# Patient Record
Sex: Male | Born: 1995 | Race: Black or African American | Hispanic: No | Marital: Married | Smoking: Current every day smoker
Health system: Southern US, Community
[De-identification: ages and names within clinical notes are randomized; demographics above are authoritative.]

## PROBLEM LIST (undated history)

## (undated) DIAGNOSIS — J45909 Unspecified asthma, uncomplicated: Secondary | ICD-10-CM

## (undated) DIAGNOSIS — J189 Pneumonia, unspecified organism: Secondary | ICD-10-CM

---
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
## 2014-01-20 ENCOUNTER — Emergency Department (HOSPITAL_COMMUNITY)
Admission: EM | Admit: 2014-01-20 | Discharge: 2014-01-20 | Disposition: A | Discharge status: Home / Self Care | Payer: Medicaid Other | Attending: Emergency Medicine | Admitting: Emergency Medicine

## 2014-01-20 ENCOUNTER — Encounter (HOSPITAL_COMMUNITY): Payer: Self-pay

## 2014-01-20 DIAGNOSIS — Z720 Tobacco use: Secondary | ICD-10-CM | POA: Diagnosis not present

## 2014-01-20 DIAGNOSIS — Z113 Encounter for screening for infections with a predominantly sexual mode of transmission: Secondary | ICD-10-CM | POA: Insufficient documentation

## 2014-01-20 DIAGNOSIS — Z79899 Other long term (current) drug therapy: Secondary | ICD-10-CM | POA: Diagnosis not present

## 2014-01-20 DIAGNOSIS — Z711 Person with feared health complaint in whom no diagnosis is made: Secondary | ICD-10-CM

## 2014-01-20 LAB — URINALYSIS, ROUTINE W REFLEX MICROSCOPIC
BILIRUBIN URINE: NEGATIVE
GLUCOSE, UA: NEGATIVE mg/dL
HGB URINE DIPSTICK: NEGATIVE
Ketones, ur: NEGATIVE mg/dL
LEUKOCYTES UA: NEGATIVE
Nitrite: NEGATIVE
Protein, ur: NEGATIVE mg/dL
Specific Gravity, Urine: 1.007 (ref 1.005–1.030)
Urobilinogen, UA: 0.2 mg/dL (ref 0.0–1.0)
pH: 7.5 (ref 5.0–8.0)

## 2014-01-20 MED ORDER — AZITHROMYCIN 1 G PO PACK
1.0000 g | PACK | Freq: Once | ORAL | Status: AC
  Administered 2014-01-20: 1 g via ORAL
  Filled 2014-01-20: qty 1

## 2014-01-20 MED ORDER — CEFTRIAXONE SODIUM 250 MG IJ SOLR
250.0000 mg | Freq: Once | INTRAMUSCULAR | Status: AC
  Administered 2014-01-20: 250 mg via INTRAMUSCULAR
  Filled 2014-01-20: qty 250

## 2014-01-20 MED ORDER — LIDOCAINE HCL 1 % IJ SOLN
INTRAMUSCULAR | Status: AC
  Administered 2014-01-20: 20 mL
  Filled 2014-01-20: qty 20

## 2014-01-20 NOTE — ED Notes (Signed)
Per pt, having redness at tip of penis and warmth.  Denies pain or discharge.  Wants STD check

## 2014-01-20 NOTE — Discharge Instructions (Signed)

## 2014-01-20 NOTE — ED Provider Notes (Signed)
CSN: [id number]     Arrival date & time 01/20/14  1857 History  This chart was scribed for non-physician practitioner, Jinny Sanders, PA-C,working with Donnetta Hutching, MD, by Karle Plumber, ED Scribe. This patient was seen in room WTR6/WTR6 and the patient's care was started at 7:49 PM.  Chief Complaint  Patient presents with  . SEXUALLY TRANSMITTED DISEASE   The history is provided by the patient. No language interpreter was used.    HPI Comments:  Neil Bryan is a 19 y.o. male who presents to the Emergency Department complaining of warmth to the tip of the penis secondary to having unprotected sex with a new sexual partner within the past couple of weeks. He reports associated irritation of the tip of the penis and an occasional, slight pinching pain to the tip. He reports one episode of mild, right teste pain last night that has since resolved. Denies dysuria, penile discharge, fever, abdominal pain, nausea, vomiting or discoloration of the penis.   History reviewed. No pertinent past medical history. History reviewed. No pertinent past surgical history. Family History  Problem Relation Age of Onset  . Hypertension Father   . Hypertension Paternal Grandfather    History  Substance Use Topics  . Smoking status: Current Every Day Smoker -- 0.50 packs/day    Types: Cigarettes  . Smokeless tobacco: Never Used  . Alcohol Use: Yes     Comment: Occasional    Review of Systems  Constitutional: Negative for fever and chills.  Gastrointestinal: Negative for nausea, vomiting and abdominal pain.  Genitourinary: Positive for penile pain and testicular pain. Negative for dysuria, discharge and scrotal swelling.       Redness and warmth to tip of penis.  All other systems reviewed and are negative.   Allergies  Review of patient's allergies indicates no known allergies.  Home Medications   Prior to Admission medications   Medication Sig Start Date End Date Taking? Authorizing Provider   albuterol (PROVENTIL HFA;VENTOLIN HFA) 108 (90 BASE) MCG/ACT inhaler Inhale 1-2 puffs into the lungs every 4 (four) hours as needed for wheezing or shortness of breath. 08/02/13   Graylon Good, PA-C  azithromycin (ZITHROMAX Z-PAK) 250 MG tablet Use as directed 08/02/13   Graylon Good, PA-C  benzonatate (TESSALON) 200 MG capsule Take 1 capsule (200 mg total) by mouth 3 (three) times daily as needed for cough. 05/28/13   Olivia Mackie, MD  Fexofenadine HCl (ALLEGRA PO) Take 1 tablet by mouth once a week. Days of the week vary    Historical Provider, MD  ibuprofen (ADVIL,MOTRIN) 800 MG tablet Take 800 mg by mouth 3 (three) times daily as needed for moderate pain.    Historical Provider, MD  predniSONE (DELTASONE) 10 MG tablet 4 tabs PO QD for 4 days; 3 tabs PO QD for 3 days; 2 tabs PO QD for 2 days; 1 tab PO QD for 1 day 08/02/13   Graylon Good, PA-C   Triage Vitals: BP 158/77 mmHg  Pulse 114  Temp(Src) 99.9 F (37.7 C) (Oral)  Resp 16  SpO2 99% Physical Exam  Constitutional: He is oriented to person, place, and time. He appears well-developed and well-nourished. No distress.  HENT:  Head: Normocephalic and atraumatic.  Eyes: EOM are normal. Right eye exhibits no discharge. Left eye exhibits no discharge. No scleral icterus.  Neck: Normal range of motion.  Cardiovascular: Normal rate.   Pulmonary/Chest: Effort normal. No respiratory distress.  Abdominal: Hernia confirmed negative in the  right inguinal area and confirmed negative in the left inguinal area.  Genitourinary: Testes normal and penis normal. Cremasteric reflex is present. Right testis shows no mass, no swelling and no tenderness. Left testis shows no mass, no swelling and no tenderness. Circumcised. No phimosis, paraphimosis, hypospadias, penile erythema or penile tenderness. No discharge found.  No erythema or discharge noted on exam. Normal scrotum and testicular exam. Chaperone present during entire genitourinary exam.   Musculoskeletal: Normal range of motion.  Neurological: He is alert and oriented to person, place, and time.  Skin: Skin is warm and dry. He is not diaphoretic.  Psychiatric: He has a normal mood and affect. His behavior is normal.  Nursing note and vitals reviewed.   ED Course  Procedures (including critical care time) DIAGNOSTIC STUDIES: Oxygen Saturation is 99% on RA, normal by my interpretation.   COORDINATION OF CARE: 7:56 PM- Will test and treat for GC/chlamydia. Pt verbalizes understanding and agrees to plan.  Medications  cefTRIAXone (ROCEPHIN) injection 250 mg (250 mg Intramuscular Given 01/20/14 2037)  azithromycin (ZITHROMAX) powder 1 g (1 g Oral Given 01/20/14 2035)  lidocaine (XYLOCAINE) 1 % (with pres) injection (20 mLs  Given 01/20/14 2037)    Labs Review Labs Reviewed  URINALYSIS, ROUTINE W REFLEX MICROSCOPIC - Abnormal; Notable for the following:    APPearance CLOUDY (*)    All other components within normal limits  GC/CHLAMYDIA PROBE AMP  RPR  HIV ANTIBODY (ROUTINE TESTING)    Imaging Review No results found.   EKG Interpretation None      MDM   Final diagnoses:  Concern about STD in male without diagnosis    Patient to be discharged with instructions to follow up with PCP. Pt understands GC/Chlamydia cultures pending and that they will need to inform all sexual partners within the last 6 months if results return positive. Pt has been treated prophylacticly with azithromycin and rocephin due to pts history. Pt advised that he will receive a call in 48 hours if the test is positive and to refrain from sexual activity for 48 hours. If the test is positive, pt is advised to refrain from sexual activity for 10 days for the medicine to take effect.  Pt not concerning for balantitis or epididymitis. Results also pending for HIV, RPR.  I personally performed the services described in this documentation, which was scribed in my presence. The recorded information  has been reviewed and is accurate.  BP 134/92 mmHg  Pulse 85  Temp(Src) 99.9 F (37.7 C) (Oral)  Resp 20  SpO2 97%  Signed,  Ladona MowJoe Lyly Canizales, PA-C 9:51 PM   Monte FantasiaJoseph W Yosgart Pavey, PA-C 01/20/14 2152  Donnetta HutchingBrian Cook, MD 01/21/14 1118

## 2014-01-21 LAB — GC/CHLAMYDIA PROBE AMP
CT Probe RNA: NEGATIVE
GC PROBE AMP APTIMA: NEGATIVE

## 2014-01-22 LAB — HIV ANTIBODY (ROUTINE TESTING W REFLEX)
HIV 1/O/2 Abs-Index Value: <1 (ref <1.00)
HIV-1/HIV-2 Ab: NONREACTIVE

## 2014-01-22 LAB — RPR: RPR Ser Ql: NONREACTIVE — AB

## 2014-03-13 ENCOUNTER — Encounter (HOSPITAL_COMMUNITY): Payer: Self-pay | Admitting: Emergency Medicine

## 2014-03-13 ENCOUNTER — Emergency Department (INDEPENDENT_AMBULATORY_CARE_PROVIDER_SITE_OTHER)
Admission: EM | Admit: 2014-03-13 | Discharge: 2014-03-13 | Disposition: A | Discharge status: Home / Self Care | Payer: BLUE CROSS/BLUE SHIELD | Source: Home / Self Care | Attending: Emergency Medicine | Admitting: Emergency Medicine

## 2014-03-13 DIAGNOSIS — M6740 Ganglion, unspecified site: Secondary | ICD-10-CM | POA: Diagnosis not present

## 2014-03-13 MED ORDER — MELOXICAM 15 MG PO TABS: 15.0000 mg | ORAL_TABLET | Freq: Every day | ORAL | Status: DC

## 2014-03-13 NOTE — ED Provider Notes (Signed)
   Chief Complaint   Wrist Problem   History of Present Illness   Neil PongKevin A Vuncannon is a 19 year old male who has had a four-day history of a cystic mass in the volar right wrist, just proximal to the proximal palmar crease. He denies any injury to the area. It hurts if he presses on it. The wrist feels a little bit tight, but has a full range of motion with minimal pain. No numbness or tingling in the hand or wrist.  Review of Systems   Other than as noted above, the patient denies any of the following symptoms: Systemic:  No fevers or chills. Musculoskeletal:  No joint pain or arthritis.  Neurological:  No muscular weakness or paresthesias.  PMFSH   Past medical history, family history, social history, meds, and allergies were reviewed.     Physical Examination   Vital signs:  BP 138/80 mmHg  Pulse 82  Temp(Src) 99 F (37.2 C) (Oral)  Resp 17  SpO2 98% Gen:  Alert and in no distress. Musculoskeletal:  Exam of the hand reveals there is a 1 cm, cystic feeling mass which is minimally tender to palpation in the volar aspect of the right wrist, about 2 cm from the proximal wrist crease.  Otherwise, all joints had a full a ROM with no swelling, bruising or deformity.  No edema, pulses full. Extremities were warm and pink.  Capillary refill was brisk.  Skin:  Clear, warm and dry.  No rash. Neuro:  Alert and oriented.  Muscle strength was normal.  Sensation was intact to light touch.   Course in Urgent Care Center   He was given a wrist splint.  Assessment   The encounter diagnosis was Ganglion cyst.  Probably due to overuse.  Plan  1.  Meds:  The following meds were prescribed:   Discharge Medication List as of 03/13/2014  7:35 PM    START taking these medications   Details  meloxicam (MOBIC) 15 MG tablet Take 1 tablet (15 mg total) by mouth daily., Starting 03/13/2014, Until Discontinued, Normal        2.  Patient Education/Counseling:  The patient was given  appropriate handouts, self care instructions, and instructed in symptomatic relief, including rest and activity, ice, and elevation. He is to wear the wrist splint continuously for the next 2 weeks and take the anti-inflammatories. If this hasn't gotten better suggest he follow-up with Dr. Bradly BienenstockFred Ortmann.  3.  Follow up:  The patient was told to follow up here if no better in 3 to 4 days, or sooner if becoming worse in any way, and given some red flag symptoms such as worsening pain, fever, swelling, or neurological symptoms which would prompt immediate return.        Reuben Likesavid C Deon Duer, MD 03/13/14 [phone]

## 2014-03-13 NOTE — Discharge Instructions (Signed)

## 2014-03-13 NOTE — ED Notes (Signed)
Pt states that he woke up 3 days ago to a lump on his right wrist it is swollen. Pt denies any injury or fall.

## 2014-05-11 ENCOUNTER — Emergency Department (HOSPITAL_COMMUNITY): Payer: Medicaid Other

## 2014-05-11 ENCOUNTER — Encounter (HOSPITAL_COMMUNITY): Payer: Self-pay | Admitting: Emergency Medicine

## 2014-05-11 ENCOUNTER — Emergency Department (HOSPITAL_COMMUNITY)
Admission: EM | Admit: 2014-05-11 | Discharge: 2014-05-11 | Disposition: A | Discharge status: Home / Self Care | Payer: Medicaid Other | Attending: Emergency Medicine | Admitting: Emergency Medicine

## 2014-05-11 DIAGNOSIS — Z720 Tobacco use: Secondary | ICD-10-CM | POA: Insufficient documentation

## 2014-05-11 DIAGNOSIS — R000 Tachycardia, unspecified: Secondary | ICD-10-CM | POA: Diagnosis not present

## 2014-05-11 DIAGNOSIS — J069 Acute upper respiratory infection, unspecified: Secondary | ICD-10-CM | POA: Insufficient documentation

## 2014-05-11 DIAGNOSIS — Z791 Long term (current) use of non-steroidal anti-inflammatories (NSAID): Secondary | ICD-10-CM | POA: Insufficient documentation

## 2014-05-11 DIAGNOSIS — J029 Acute pharyngitis, unspecified: Secondary | ICD-10-CM | POA: Insufficient documentation

## 2014-05-11 DIAGNOSIS — R05 Cough: Secondary | ICD-10-CM | POA: Diagnosis present

## 2014-05-11 DIAGNOSIS — H9201 Otalgia, right ear: Secondary | ICD-10-CM | POA: Insufficient documentation

## 2014-05-11 DIAGNOSIS — J45901 Unspecified asthma with (acute) exacerbation: Secondary | ICD-10-CM | POA: Insufficient documentation

## 2014-05-11 DIAGNOSIS — Z79899 Other long term (current) drug therapy: Secondary | ICD-10-CM | POA: Diagnosis not present

## 2014-05-11 DIAGNOSIS — R059 Cough, unspecified: Secondary | ICD-10-CM

## 2014-05-11 LAB — RAPID STREP SCREEN (MED CTR MEBANE ONLY): Streptococcus, Group A Screen (Direct): NEGATIVE

## 2014-05-11 MED ORDER — IBUPROFEN 400 MG PO TABS
400.0000 mg | ORAL_TABLET | Freq: Once | ORAL | Status: AC
  Administered 2014-05-11: 400 mg via ORAL
  Filled 2014-05-11: qty 1

## 2014-05-11 MED ORDER — PREDNISONE 20 MG PO TABS
60.0000 mg | ORAL_TABLET | Freq: Once | ORAL | Status: AC
  Administered 2014-05-11: 60 mg via ORAL
  Filled 2014-05-11: qty 3

## 2014-05-11 MED ORDER — PREDNISONE 20 MG PO TABS: ORAL_TABLET | ORAL | Status: DC

## 2014-05-11 NOTE — ED Provider Notes (Signed)
CSN: [id number]     Arrival date & time 05/11/14  2033 History  This chart was scribed for non-physician practitioner working with Blake Divine, MD by Richarda Overlie, ED Scribe. This patient was seen in room TR06C/TR06C and the patient's care was started at 9:47 PM.   Chief Complaint  Patient presents with  . Cough   Patient is a 19 y.o. male presenting with cough. The history is provided by the patient. No language interpreter was used.  Cough Cough characteristics:  Productive Sputum characteristics:  Green and yellow Severity:  Mild Onset quality:  Unable to specify Duration:  3 days Timing:  Intermittent Chronicity:  New Smoker: no   Context: upper respiratory infection   Context: not sick contacts   Relieved by:  Decongestant Worsened by:  Nothing tried Ineffective treatments:  None tried Associated symptoms: chest pain (only with cough), ear pain, rhinorrhea, sinus congestion and sore throat   Associated symptoms: no chills, no ear fullness, no eye discharge, no fever, no myalgias, no rash, no shortness of breath and no wheezing    HPI Comments: Neil Bryan is a 19 y.o. male with a history of reactive airway disease who presents to the Emergency Department complaining of a productive cough with a green/yellow mucous for the last 3 days. Pt reports associated sore throat, rhinorrhea, right ear pain, and intermittent chest pain. He states that he only has CP when he coughs. He describes his pain right ear pain as dull, non-radiating and rates it as a 4/10 at this time.  Pt reports he had a low grade fever earlier with a temperature of 99.4.  He states that has been taking dayquil which provided him relief initially but has not recently. Pt reports a history of community acquired pneumonia and seasonal allergies. He reports no known sick contacts. He states that he has not recently traveled. Pt reports NKDA. He reports no history of DM or any other medical problems. He denies  fevers, chills, ear discharge, neck swelling, LAD, CP at rest, SOB, wheezing, abdominal pain, nausea and vomiting, diarrhea, dysuria, hematuria, weakness, tingling or numbness. Denies rashes.  History reviewed. No pertinent past medical history. History reviewed. No pertinent past surgical history. Family History  Problem Relation Age of Onset  . Hypertension Father   . Hypertension Paternal Grandfather    History  Substance Use Topics  . Smoking status: Current Every Day Smoker -- 0.50 packs/day    Types: Cigarettes  . Smokeless tobacco: Never Used  . Alcohol Use: Yes     Comment: Occasional    Review of Systems  Constitutional: Negative for fever and chills.  HENT: Positive for congestion, ear pain, rhinorrhea and sore throat. Negative for drooling, ear discharge, sinus pressure and trouble swallowing.   Eyes: Negative for pain and discharge.  Respiratory: Positive for cough. Negative for shortness of breath and wheezing.   Cardiovascular: Positive for chest pain (only with cough).  Gastrointestinal: Negative for nausea, vomiting, abdominal pain and diarrhea.  Genitourinary: Negative for dysuria and hematuria.  Musculoskeletal: Negative for myalgias, arthralgias and neck pain.  Skin: Negative for rash.  Allergic/Immunologic: Positive for environmental allergies. Negative for immunocompromised state.  Neurological: Negative for weakness and numbness.  Hematological: Negative for adenopathy.    10 Systems reviewed and all are negative for acute change except as noted in the HPI.  Allergies  Review of patient's allergies indicates no known allergies.  Home Medications   Prior to Admission medications   Medication Sig Start  Date End Date Taking? Authorizing Provider  albuterol (PROVENTIL HFA;VENTOLIN HFA) 108 (90 BASE) MCG/ACT inhaler Inhale 1-2 puffs into the lungs every 4 (four) hours as needed for wheezing or shortness of breath. 08/02/13   Graylon GoodZachary H Baker, PA-C   azithromycin (ZITHROMAX Z-PAK) 250 MG tablet Use as directed 08/02/13   Graylon GoodZachary H Baker, PA-C  benzonatate (TESSALON) 200 MG capsule Take 1 capsule (200 mg total) by mouth 3 (three) times daily as needed for cough. 05/28/13   Marisa Severinlga Otter, MD  Fexofenadine HCl (ALLEGRA PO) Take 1 tablet by mouth once a week. Days of the week vary    Historical Provider, MD  ibuprofen (ADVIL,MOTRIN) 800 MG tablet Take 800 mg by mouth 3 (three) times daily as needed for moderate pain.    Historical Provider, MD  meloxicam (MOBIC) 15 MG tablet Take 1 tablet (15 mg total) by mouth daily. 03/13/14   Reuben Likesavid C Keller, MD  predniSONE (DELTASONE) 10 MG tablet 4 tabs PO QD for 4 days; 3 tabs PO QD for 3 days; 2 tabs PO QD for 2 days; 1 tab PO QD for 1 day 08/02/13   Graylon GoodZachary H Baker, PA-C   BP 139/72 mmHg  Pulse 118  Temp(Src) 99.1 F (37.3 C) (Oral)  Resp 16  SpO2 95% Physical Exam  Constitutional: He is oriented to person, place, and time. Vital signs are normal. He appears well-developed and well-nourished.  Non-toxic appearance. No distress.  Afebrile although low grade temp of 99.1, nontoxic, NAD  HENT:  Head: Normocephalic and atraumatic.  Right Ear: Hearing, tympanic membrane, external ear and ear canal normal.  Left Ear: Hearing, tympanic membrane, external ear and ear canal normal.  Nose: Mucosal edema and rhinorrhea present.  Mouth/Throat: Uvula is midline and mucous membranes are normal. No trismus in the jaw. No uvula swelling. Posterior oropharyngeal erythema present. No oropharyngeal exudate, posterior oropharyngeal edema or tonsillar abscesses.  Ears clear bilaterally Nose with mild mucosal edema and erythema, clear rhinorrhea. Oropharynx with posterior injection, no tonsillar swelling or exudates, uvula midline without deviationn or swelling, no trismus or drooling, no PTA  Eyes: Conjunctivae and EOM are normal. Right eye exhibits no discharge. Left eye exhibits no discharge.  Neck: Normal range of motion.  Neck supple.  Cardiovascular: Regular rhythm, normal heart sounds and intact distal pulses.  Tachycardia present.  Exam reveals no gallop and no friction rub.   No murmur heard. Mildly tachycardic which resolved upon exam  Pulmonary/Chest: Effort normal. No respiratory distress. He has decreased breath sounds in the right lower field. He has no wheezes. He has no rhonchi. He has no rales. He exhibits tenderness. He exhibits no crepitus and no retraction.    Slightly diminished breath sounds in RLF, no w/r/r, no hypoxia or increased WOB, speaking in full sentences, SpO2 100% on RA. Mild chest wall TTP to lateral chest, no crepitus or deformity  Abdominal: Soft. Normal appearance and bowel sounds are normal. He exhibits no distension. There is no tenderness. There is no rigidity, no rebound and no guarding.  Musculoskeletal: Normal range of motion.  MAE x4 Strength and sensation grossly intact Distal pulses intact  Lymphadenopathy:       Head (right side): Tonsillar adenopathy present.       Head (left side): Tonsillar adenopathy present.    He has cervical adenopathy.  Shotty tonsillar and cervical LAD bilaterally, which is nonTTP  Neurological: He is alert and oriented to person, place, and time. He has normal strength. No sensory deficit.  Skin: Skin is warm, dry and intact. No rash noted.  Psychiatric: He has a normal mood and affect.  Nursing note and vitals reviewed.   ED Course  Procedures   DIAGNOSTIC STUDIES: Oxygen Saturation is 95% on RA, normal by my interpretation.    COORDINATION OF CARE: 9:55 PM Discussed treatment plan with pt at bedside and pt agreed to plan.   Labs Review Labs Reviewed  RAPID STREP SCREEN  CULTURE, GROUP A STREP    Imaging Review Dg Chest 2 View  05/11/2014   CLINICAL DATA:  Acute onset of cough and shortness of breath. Initial encounter.  EXAM: CHEST  2 VIEW  COMPARISON:  Chest radiograph performed 05/28/2013  FINDINGS: The lungs are  well-aerated. Mild peribronchial thickening is noted. There is no evidence of focal opacification, pleural effusion or pneumothorax.  The heart is normal in size; the mediastinal contour is within normal limits. No acute osseous abnormalities are seen.  IMPRESSION: Mild peribronchial thickening noted; lungs otherwise clear.   Electronically Signed   By: Roanna Raider M.D.   On: 05/11/2014 22:31     EKG Interpretation None      MDM   Final diagnoses:  Cough  URI (upper respiratory infection)  Sore throat  Reactive airway disease, unspecified asthma severity, with acute exacerbation    19 y.o. male here with cough, sore throat. Hx of reactive airway disease. No wheezing. Slightly diminished breath sounds on the RL field, will obtain imaging. Doubt need for nebs but will likely send home on prednisone for possible RAD exacerbation. Will give motrin here for pain. Will swab for strep although CENTOR criteria low therefore if neg will not treat. Will reassess shortly.   11:25 PM CXR neg. RST neg. Prednisone given here. Will have him f/up with PCP in 1wk. Will give prednisone taper. Discussed use of home inhaler. Discussed symptomatic care and f/up in 1wk. I explained the diagnosis and have given explicit precautions to return to the ER including for any other new or worsening symptoms. The patient understands and accepts the medical plan as it's been dictated and I have answered their questions. Discharge instructions concerning home care and prescriptions have been given. The patient is STABLE and is discharged to home in good condition.  I personally performed the services described in this documentation, which was scribed in my presence. The recorded information has been reviewed and is accurate.   BP 125/75 mmHg  Pulse 90  Temp(Src) 98.7 F (37.1 C) (Oral)  Resp 16  Ht  (1.88 m)  Wt 190 lb (86.183 kg)  BMI 24.38 kg/m2  SpO2 100%  Meds ordered this encounter  Medications  .  ibuprofen (ADVIL,MOTRIN) tablet 400 mg    Sig:   . predniSONE (DELTASONE) tablet 60 mg    Sig:   . predniSONE (DELTASONE) 20 MG tablet    Sig: 3 tabs po daily x 3 days    Dispense:  9 tablet    Refill:  0    Order Specific Question:  Supervising Provider    Answer:  Eber Hong [[id number]]       Nyjah Denio Camprubi-Soms, PA-C 05/11/14 2326  Blake Divine, MD 05/13/14 1223

## 2014-05-11 NOTE — ED Notes (Signed)
Pt. reports persistent dry cough with chest congestion and sore throat onset this week , denies fever or chills. Respirations unlabored.

## 2014-05-11 NOTE — Discharge Instructions (Signed)
Continue to stay well-hydrated. Gargle warm salt water and spit it out. Continue to alternate between Tylenol and Ibuprofen for pain or fever. Use Mucinex for cough suppression/expectoration of mucus. Use netipot and flonase to help with nasal congestion. May consider over-the-counter Benadryl or other antihistamine to decrease secretions and for watery itchy eyes. Use your home inhaler as directed, as needed for cough/chest congestion. Take prednisone as directed. Followup with your primary care doctor in 5-7 days for recheck of ongoing symptoms. Return to emergency department for emergent changing or worsening of symptoms.   Bronchospasm A bronchospasm is when the tubes that carry air in and out of your lungs (airways) spasm or tighten. During a bronchospasm it is hard to breathe. This is because the airways get smaller. A bronchospasm can be triggered by:  Allergies. These may be to animals, pollen, food, or mold.  Infection. This is a common cause of bronchospasm.  Exercise.  Irritants. These include pollution, cigarette smoke, strong odors, aerosol sprays, and paint fumes.  Weather changes.  Stress.  Being emotional. HOME CARE   Always have a plan for getting help. Know when to call your doctor and local emergency services (911 in the [place] Know where you can get emergency care.  Only take medicines as told by your doctor.  If you were prescribed an inhaler or nebulizer machine, ask your doctor how to use it correctly. Always use a spacer with your inhaler if you were given one.  Stay calm during an attack. Try to relax and breathe more slowly.  Control your home environment:  Change your heating and air conditioning filter at least once a month.  Limit your use of fireplaces and wood stoves.  Do not  smoke. Do not  allow smoking in your home.  Avoid perfumes and fragrances.  Get rid of pests (such as roaches and mice) and their droppings.  Throw away plants if you see  mold on them.  Keep your house clean and dust free.  Replace carpet with wood, tile, or vinyl flooring. Carpet can trap dander and dust.  Use allergy-proof pillows, mattress covers, and box spring covers.  Wash bed sheets and blankets every week in hot water. Dry them in a dryer.  Use blankets that are made of polyester or cotton.  Wash hands frequently. GET HELP IF:  You have muscle aches.  You have chest pain.  The thick spit you spit or cough up (sputum) changes from clear or white to yellow, green, gray, or bloody.  The thick spit you spit or cough up gets thicker.  There are problems that may be related to the medicine you are given such as:  A rash.  Itching.  Swelling.  Trouble breathing. GET HELP RIGHT AWAY IF:  You feel you cannot breathe or catch your breath.  You cannot stop coughing.  Your treatment is not helping you breathe better.  You have very bad chest pain. MAKE SURE YOU:   Understand these instructions.  Will watch your condition.  Will get help right away if you are not doing well or get worse. Document Released: 10/27/2008 Document Revised: 01/04/2013 Document Reviewed: 06/22/2012 [facility] Patient Information 2015 [place], [place]. This information is not intended to replace advice given to you by your health care provider. Make sure you discuss any questions you have with your health care provider.  Cough, Adult  A cough is a reflex. It helps you clear your throat and airways. A cough can help heal your body.  A cough can last 2 or 3 weeks (acute) or may last more than 8 weeks (chronic). Some common causes of a cough can include an infection, allergy, or a cold. HOME CARE  Only take medicine as told by your doctor.  If given, take your medicines (antibiotics) as told. Finish them even if you start to feel better.  Use a cold steam vaporizer or humidifier in your home. This can help loosen thick spit (secretions).  Sleep so you are  almost sitting up (semi-upright). Use pillows to do this. This helps reduce coughing.  Rest as needed.  Stop smoking if you smoke. GET HELP RIGHT AWAY IF:  You have yellowish-white fluid (pus) in your thick spit.  Your cough gets worse.  Your medicine does not reduce coughing, and you are losing sleep.  You cough up blood.  You have trouble breathing.  Your pain gets worse and medicine does not help.  You have a fever. MAKE SURE YOU:   Understand these instructions.  Will watch your condition.  Will get help right away if you are not doing well or get worse. Document Released: 09/12/2010 Document Revised: 05/16/2013 Document Reviewed: 09/12/2010 [facility] Patient Information 2015 [place]. This information is not intended to replace advice given to you by your health care provider. Make sure you discuss any questions you have with your health care provider.  Salt Water Gargle This solution will help make your mouth and throat feel better. HOME CARE INSTRUCTIONS   Mix 1 teaspoon of salt in 8 ounces of warm water.  Gargle with this solution as much or often as you need or as directed. Swish and gargle gently if you have any sores or wounds in your mouth.  Do not swallow this mixture. Document Released: 10/04/2003 Document Revised: 03/24/2011 Document Reviewed: 02/25/2008 [facility] Patient Information 2015 [place], [place]. This information is not intended to replace advice given to you by your health care provider. Make sure you discuss any questions you have with your health care provider.

## 2014-05-14 LAB — CULTURE, GROUP A STREP: STREP A CULTURE: NEGATIVE

## 2014-06-08 ENCOUNTER — Encounter (HOSPITAL_COMMUNITY): Payer: Self-pay | Admitting: Emergency Medicine

## 2014-06-08 ENCOUNTER — Emergency Department (HOSPITAL_COMMUNITY)
Admission: EM | Admit: 2014-06-08 | Discharge: 2014-06-08 | Disposition: A | Discharge status: Home / Self Care | Payer: Medicaid Other | Attending: Emergency Medicine | Admitting: Emergency Medicine

## 2014-06-08 DIAGNOSIS — Z720 Tobacco use: Secondary | ICD-10-CM | POA: Diagnosis not present

## 2014-06-08 DIAGNOSIS — Z79899 Other long term (current) drug therapy: Secondary | ICD-10-CM | POA: Insufficient documentation

## 2014-06-08 DIAGNOSIS — Z711 Person with feared health complaint in whom no diagnosis is made: Secondary | ICD-10-CM

## 2014-06-08 DIAGNOSIS — Z791 Long term (current) use of non-steroidal anti-inflammatories (NSAID): Secondary | ICD-10-CM | POA: Insufficient documentation

## 2014-06-08 DIAGNOSIS — Z202 Contact with and (suspected) exposure to infections with a predominantly sexual mode of transmission: Secondary | ICD-10-CM | POA: Diagnosis not present

## 2014-06-08 HISTORY — DX: Unspecified asthma, uncomplicated: J45.909

## 2014-06-08 NOTE — ED Provider Notes (Signed)
CSN: [id number]     Arrival date & time 06/08/14  0921 History  This chart was scribed for non-physician practitioner, Emilia Beck, PA-C, working with Jerelyn Scott, MD, by Lionel December, ED Scribe. This patient was seen in room TR09C/TR09C and the patient's care was started at 9:37 AM.    (Consider location/radiation/quality/duration/timing/severity/associated sxs/prior Treatment) The history is provided by the patient. No language interpreter was used.    HPI Comments: Neil Bryan is a 19 y.o. male who presents to the Emergency Department for an STD check.  Patient is unsure if he has been exposed to anything after sleeping with 2 females without using any protection.  Patient denies any discharge or dysuria. Patient has had gonorrhea in the past.  He has no other concerns today.     No past medical history on file. No past surgical history on file. Family History  Problem Relation Age of Onset  . Hypertension Father   . Hypertension Paternal Grandfather    History  Substance Use Topics  . Smoking status: Current Every Day Smoker -- 0.50 packs/day    Types: Cigarettes  . Smokeless tobacco: Never Used  . Alcohol Use: Yes     Comment: Occasional    Review of Systems  Constitutional: Negative for fever, chills and fatigue.  HENT: Negative for trouble swallowing.   Eyes: Negative for visual disturbance.  Respiratory: Negative for cough and shortness of breath.   Cardiovascular: Negative for chest pain and palpitations.  Gastrointestinal: Negative for nausea, vomiting, abdominal pain and diarrhea.  Genitourinary: Negative for dysuria and difficulty urinating.  Musculoskeletal: Negative for arthralgias and neck pain.  Skin: Negative for color change.  Neurological: Negative for dizziness and weakness.  Psychiatric/Behavioral: Negative for dysphoric mood.      Allergies  Review of patient's allergies indicates no known allergies.  Home Medications   Prior to  Admission medications   Medication Sig Start Date End Date Taking? Authorizing Provider  albuterol (PROVENTIL HFA;VENTOLIN HFA) 108 (90 BASE) MCG/ACT inhaler Inhale 1-2 puffs into the lungs every 4 (four) hours as needed for wheezing or shortness of breath. 08/02/13   Graylon Good, PA-C  azithromycin (ZITHROMAX Z-PAK) 250 MG tablet Use as directed 08/02/13   Graylon Good, PA-C  benzonatate (TESSALON) 200 MG capsule Take 1 capsule (200 mg total) by mouth 3 (three) times daily as needed for cough. 05/28/13   Marisa Severin, MD  Fexofenadine HCl (ALLEGRA PO) Take 1 tablet by mouth once a week. Days of the week vary    Historical Provider, MD  ibuprofen (ADVIL,MOTRIN) 800 MG tablet Take 800 mg by mouth 3 (three) times daily as needed for moderate pain.    Historical Provider, MD  meloxicam (MOBIC) 15 MG tablet Take 1 tablet (15 mg total) by mouth daily. 03/13/14   Reuben Likes, MD  predniSONE (DELTASONE) 10 MG tablet 4 tabs PO QD for 4 days; 3 tabs PO QD for 3 days; 2 tabs PO QD for 2 days; 1 tab PO QD for 1 day 08/02/13   Graylon Good, PA-C  predniSONE (DELTASONE) 20 MG tablet 3 tabs po daily x 3 days 05/11/14   Mercedes Camprubi-Soms, PA-C   There were no vitals taken for this visit. Physical Exam  Constitutional: He is oriented to person, place, and time. He appears well-developed and well-nourished. No distress.  HENT:  Head: Normocephalic and atraumatic.  Eyes: Conjunctivae and EOM are normal.  Neck: Normal range of motion. Neck supple.  Cardiovascular:  Normal rate.   Pulmonary/Chest: Effort normal. No respiratory distress.  Abdominal: Soft. He exhibits no distension. There is no tenderness. There is no rebound.  Genitourinary: Penis normal.  No discharge noted at the meatus.   Musculoskeletal: Normal range of motion.  Neurological: He is alert and oriented to person, place, and time. Coordination normal.  Skin: Skin is warm and dry.  Psychiatric: He has a normal mood and affect. His  behavior is normal.  Nursing note and vitals reviewed.   ED Course  Procedures (including critical care time) DIAGNOSTIC STUDIES: Oxygen Saturation is 100% on RA, normal by my interpretation.    COORDINATION OF CARE: 9:40 AM Discussed treatment plan with patient at beside, the patient agrees with the plan and has no further questions at this time.   Labs Review Labs Reviewed - No data to display  Imaging Review No results found.   EKG Interpretation None      MDM   Final diagnoses:  Concern about STD in male without diagnosis   9:43 AM Patient's GC/Chlamydia swab sent for culture. Patient will be contacted in 48 hours if results are positive.   I personally performed the services described in this documentation, which was scribed in my presence. The recorded information has been reviewed and is accurate.    BridgeportKaitlyn Abdallah Hern, PA-C 06/08/14 [id number]  Jerelyn ScottMartha Linker, MD 06/08/14 1011

## 2014-06-08 NOTE — ED Notes (Signed)
Patient states wanted to get checked for STD.   Patient states unsure if he has been exposed to anything, but has had ghonorrhea previously and is just making sure.  Patient states has slept with two women unprotected sex recently.  Denies symptoms.

## 2014-06-09 LAB — GC/CHLAMYDIA PROBE AMP (~~LOC~~) NOT AT ARMC
Chlamydia: NEGATIVE
Neisseria Gonorrhea: NEGATIVE

## 2014-09-25 ENCOUNTER — Encounter (HOSPITAL_COMMUNITY): Payer: Self-pay | Admitting: *Deleted

## 2014-09-25 ENCOUNTER — Emergency Department (HOSPITAL_COMMUNITY)
Admission: EM | Admit: 2014-09-25 | Discharge: 2014-09-25 | Disposition: A | Discharge status: Home / Self Care | Payer: Medicaid Other | Attending: Emergency Medicine | Admitting: Emergency Medicine

## 2014-09-25 DIAGNOSIS — R360 Urethral discharge without blood: Secondary | ICD-10-CM | POA: Diagnosis present

## 2014-09-25 DIAGNOSIS — R369 Urethral discharge, unspecified: Secondary | ICD-10-CM

## 2014-09-25 DIAGNOSIS — J45909 Unspecified asthma, uncomplicated: Secondary | ICD-10-CM | POA: Insufficient documentation

## 2014-09-25 DIAGNOSIS — Z79899 Other long term (current) drug therapy: Secondary | ICD-10-CM | POA: Insufficient documentation

## 2014-09-25 DIAGNOSIS — Z720 Tobacco use: Secondary | ICD-10-CM | POA: Insufficient documentation

## 2014-09-25 DIAGNOSIS — Z791 Long term (current) use of non-steroidal anti-inflammatories (NSAID): Secondary | ICD-10-CM | POA: Diagnosis not present

## 2014-09-25 DIAGNOSIS — N451 Epididymitis: Secondary | ICD-10-CM | POA: Diagnosis not present

## 2014-09-25 MED ORDER — LIDOCAINE HCL (PF) 1 % IJ SOLN
INTRAMUSCULAR | Status: AC
  Administered 2014-09-25: 0.9 mL
  Filled 2014-09-25: qty 5

## 2014-09-25 MED ORDER — DOXYCYCLINE HYCLATE 100 MG PO CAPS: 100.0000 mg | ORAL_CAPSULE | Freq: Two times a day (BID) | ORAL | Status: DC

## 2014-09-25 MED ORDER — METRONIDAZOLE 500 MG PO TABS
2000.0000 mg | ORAL_TABLET | Freq: Once | ORAL | Status: AC
  Administered 2014-09-25: 2000 mg via ORAL
  Filled 2014-09-25: qty 4

## 2014-09-25 MED ORDER — AZITHROMYCIN 250 MG PO TABS
1000.0000 mg | ORAL_TABLET | Freq: Once | ORAL | Status: AC
  Administered 2014-09-25: 1000 mg via ORAL
  Filled 2014-09-25: qty 4

## 2014-09-25 MED ORDER — CEFTRIAXONE SODIUM 250 MG IJ SOLR
250.0000 mg | Freq: Once | INTRAMUSCULAR | Status: AC
  Administered 2014-09-25: 250 mg via INTRAMUSCULAR
  Filled 2014-09-25: qty 250

## 2014-09-25 NOTE — ED Provider Notes (Signed)
CSN: [id number]     Arrival date & time 09/25/14  1839 History  This chart was scribed for Trixie Dredge, PA-C, working with Vanetta Mulders, MD by Elon Spanner, ED Scribe. This patient was seen in room TR10C/TR10C and the patient's care was started at 8:06 PM.   Chief Complaint  Patient presents with  . Penile Discharge   HPI HPI Comments: Neil Bryan is a 19 y.o. male who presents to the Emergency Department complaining of clear penile discharge onset yesterday.  Associated symptoms include improved, at worst 3-4/10, right testicular soreness onset this morning.  Patient reports a history penile discharge at 15 for which he sought treatment (unknown dx).  Patient has had unprotected intercourse recently.  He denies urinary urgency, dysuria, frequency, fever, chills, abdominal pain, vomiting, GI symptoms.  Denies any swelling or redness of the scrotum.      Past Medical History  Diagnosis Date  . Reactive airway disease    History reviewed. No pertinent past surgical history. Family History  Problem Relation Age of Onset  . Hypertension Father   . Hypertension Paternal Grandfather    Social History  Substance Use Topics  . Smoking status: Current Every Day Smoker -- 0.30 packs/day    Types: Cigarettes  . Smokeless tobacco: Never Used  . Alcohol Use: Yes     Comment: socially    Review of Systems  Constitutional: Negative for fever and chills.  Gastrointestinal: Negative for nausea, vomiting, abdominal pain, diarrhea and constipation.  Genitourinary: Positive for discharge and testicular pain. Negative for dysuria, urgency, frequency, penile swelling, scrotal swelling and penile pain.  Musculoskeletal: Negative for myalgias.  Skin: Negative for color change.  Allergic/Immunologic: Negative for immunocompromised state.  Hematological: Does not bruise/bleed easily.  Psychiatric/Behavioral: Negative for self-injury.      Allergies  Review of patient's allergies indicates no  known allergies.  Home Medications   Prior to Admission medications   Medication Sig Start Date End Date Taking? Authorizing Provider  albuterol (PROVENTIL HFA;VENTOLIN HFA) 108 (90 BASE) MCG/ACT inhaler Inhale 1-2 puffs into the lungs every 4 (four) hours as needed for wheezing or shortness of breath. 08/02/13   Graylon Good, PA-C  azithromycin (ZITHROMAX Z-PAK) 250 MG tablet Use as directed 08/02/13   Graylon Good, PA-C  benzonatate (TESSALON) 200 MG capsule Take 1 capsule (200 mg total) by mouth 3 (three) times daily as needed for cough. 05/28/13   Marisa Severin, MD  Fexofenadine HCl (ALLEGRA PO) Take 1 tablet by mouth once a week. Days of the week vary    Historical Provider, MD  ibuprofen (ADVIL,MOTRIN) 800 MG tablet Take 800 mg by mouth 3 (three) times daily as needed for moderate pain.    Historical Provider, MD  meloxicam (MOBIC) 15 MG tablet Take 1 tablet (15 mg total) by mouth daily. 03/13/14   Reuben Likes, MD  predniSONE (DELTASONE) 10 MG tablet 4 tabs PO QD for 4 days; 3 tabs PO QD for 3 days; 2 tabs PO QD for 2 days; 1 tab PO QD for 1 day 08/02/13   Graylon Good, PA-C  predniSONE (DELTASONE) 20 MG tablet 3 tabs po daily x 3 days 05/11/14   Mercedes Camprubi-Soms, PA-C   BP 126/77 mmHg  Pulse 104  Temp(Src) 98.8 F (37.1 C) (Oral)  Resp 17  SpO2 97% Physical Exam  Constitutional: He appears well-developed and well-nourished. No distress.  HENT:  Head: Normocephalic and atraumatic.  Neck: Neck supple.  Pulmonary/Chest: Effort normal.  Abdominal: Soft. He exhibits no distension and no mass. There is no tenderness. There is no rebound and no guarding.  Genitourinary: Penis normal. Cremasteric reflex is present. Right testis shows swelling and tenderness. Right testis shows no mass. Right testis is descended. Cremasteric reflex is not absent on the right side. Left testis shows no mass, no swelling and no tenderness. Left testis is descended. Cremasteric reflex is not absent on  the left side. Circumcised. No penile erythema or penile tenderness. No discharge found.  Very mild tenderness to the right epididymis with mild edema.  No testicular masses or other swelling.  Scrotum without erythema or edema, tenderness.  Left testicle is normal.    Lymphadenopathy:       Right: No inguinal adenopathy present.       Left: No inguinal adenopathy present.  Neurological: He is alert.  Skin: He is not diaphoretic.  Nursing note and vitals reviewed.   ED Course  Procedures (including critical care time)  DIAGNOSTIC STUDIES: Oxygen Saturation is 97% on RA, normal by my interpretation.    COORDINATION OF CARE:  8:10 PM Discussed treatment plan with patient at bedside.  Patient acknowledges and agrees with plan.     Labs Review Labs Reviewed  RPR  HIV ANTIBODY (ROUTINE TESTING)  GC/CHLAMYDIA PROBE AMP ([place]) NOT AT [facility]    Imaging Review No results found. I have personally reviewed and evaluated these images and lab results as part of my medical decision-making.   EKG Interpretation None      MDM   Final diagnoses:  Penile discharge  Epididymitis    Afebrile, nontoxic patient with penile discharge x 1 day, mild right sided testicular pain that is actually mild epididymal tenderness.  Suspect early epididymitis.  Cremasteric reflex is intact.  Given penile discharge and likelihood of this being caused by STD, suspect early epididymitis.  Doubt torsion.  Discussed this with patient and offered [place] and pt states he would be unable to stay for [place], wanted to be d/c as soon as possible.  Discussed return precautions.  Pt given rocephin, azithromycin, flagyl in ED.    D/C home with doxycyline.  STD/HIV tests pending.  Discussed result, findings, treatment, and follow up  with patient.  Pt given return precautions.  Pt verbalizes understanding and agrees with plan.        I personally performed the services described in this documentation, which was scribed in  my presence. The recorded information has been reviewed and is accurate.    Trixie Dredge, PA-C 09/25/14 2134  Vanetta Mulders, MD 09/27/14 1659

## 2014-09-25 NOTE — ED Notes (Signed)
Pt reports penile discharge that started yesterday. No distress noted at triage.

## 2014-09-25 NOTE — Discharge Instructions (Signed)
Read the information below.  Use the prescribed medication as directed.  Please discuss all new medications with your pharmacist.  You may return to the Emergency Department at any time for worsening condition or any new symptoms that concern you.   If you develop fevers, abdominal pain, vomiting, increased testicular pain or any testicular swelling or redness, return immediately to the emergency department for a recheck.

## 2014-09-26 LAB — GC/CHLAMYDIA PROBE AMP (~~LOC~~) NOT AT ARMC
CHLAMYDIA, DNA PROBE: NEGATIVE
NEISSERIA GONORRHEA: NEGATIVE

## 2014-09-26 LAB — RPR: RPR: NONREACTIVE

## 2014-09-26 LAB — HIV ANTIBODY (ROUTINE TESTING W REFLEX): HIV Screen 4th Generation wRfx: NONREACTIVE

## 2015-02-12 ENCOUNTER — Emergency Department (HOSPITAL_COMMUNITY)
Admission: EM | Admit: 2015-02-12 | Discharge: 2015-02-12 | Disposition: A | Discharge status: Home / Self Care | Payer: Medicaid Other | Attending: Emergency Medicine | Admitting: Emergency Medicine

## 2015-02-12 ENCOUNTER — Encounter (HOSPITAL_COMMUNITY): Payer: Self-pay | Admitting: Emergency Medicine

## 2015-02-12 DIAGNOSIS — F17210 Nicotine dependence, cigarettes, uncomplicated: Secondary | ICD-10-CM | POA: Insufficient documentation

## 2015-02-12 DIAGNOSIS — Z79899 Other long term (current) drug therapy: Secondary | ICD-10-CM | POA: Diagnosis not present

## 2015-02-12 DIAGNOSIS — Z791 Long term (current) use of non-steroidal anti-inflammatories (NSAID): Secondary | ICD-10-CM | POA: Insufficient documentation

## 2015-02-12 DIAGNOSIS — Z202 Contact with and (suspected) exposure to infections with a predominantly sexual mode of transmission: Secondary | ICD-10-CM | POA: Diagnosis present

## 2015-02-12 DIAGNOSIS — J45909 Unspecified asthma, uncomplicated: Secondary | ICD-10-CM | POA: Diagnosis not present

## 2015-02-12 DIAGNOSIS — Z113 Encounter for screening for infections with a predominantly sexual mode of transmission: Secondary | ICD-10-CM | POA: Diagnosis not present

## 2015-02-12 NOTE — ED Provider Notes (Signed)
CSN: [id number]     Arrival date & time 02/12/15  1240 History  By signing my name below, I, Neil Bryan, attest that this documentation has been prepared under the direction and in the presence of [employer], PA-C. Electronically Signed: Placido Bryan, ED Scribe. 02/12/2015. 1:23 PM.    Chief Complaint  Patient presents with  . Exposure to STD   The history is provided by the patient. No language interpreter was used.    HPI Comments: Neil Bryan is a 20 y.o. male who presents to the Emergency Department requesting an STD test. Pt went to a clinic earlier today and was told to make an appointment which he did not have time for so he came to the ED. He states he recently had unprotected sex with this girlfriend who he feels is untrustworthy. Pt notes a hx of gonorrhea. He denies testicular pain, testicular swelling, dysuria, penile discharge, fevers, chills or any current symptoms at this time.    Past Medical History  Diagnosis Date  . Reactive airway disease    No past surgical history on file. Family History  Problem Relation Age of Onset  . Hypertension Father   . Hypertension Paternal Grandfather    Social History  Substance Use Topics  . Smoking status: Current Every Day Smoker -- 0.30 packs/day    Types: Cigarettes  . Smokeless tobacco: Never Used  . Alcohol Use: Yes     Comment: socially    Review of Systems  All other systems reviewed and are negative.   Allergies  Review of patient's allergies indicates no known allergies.  Home Medications   Prior to Admission medications   Medication Sig Start Date End Date Taking? Authorizing Provider  albuterol (PROVENTIL HFA;VENTOLIN HFA) 108 (90 BASE) MCG/ACT inhaler Inhale 1-2 puffs into the lungs every 4 (four) hours as needed for wheezing or shortness of breath. 08/02/13   Graylon Good, PA-C  azithromycin (ZITHROMAX Z-PAK) 250 MG tablet Use as directed 08/02/13   Graylon Good, PA-C  benzonatate  (TESSALON) 200 MG capsule Take 1 capsule (200 mg total) by mouth 3 (three) times daily as needed for cough. 05/28/13   Marisa Severin, MD  doxycycline (VIBRAMYCIN) 100 MG capsule Take 1 capsule (100 mg total) by mouth 2 (two) times daily. One po bid x 7 days 09/25/14   Trixie Dredge, PA-C  Fexofenadine HCl (ALLEGRA PO) Take 1 tablet by mouth once a week. Days of the week vary    Historical Provider, MD  ibuprofen (ADVIL,MOTRIN) 800 MG tablet Take 800 mg by mouth 3 (three) times daily as needed for moderate pain.    Historical Provider, MD  meloxicam (MOBIC) 15 MG tablet Take 1 tablet (15 mg total) by mouth daily. 03/13/14   Reuben Likes, MD  predniSONE (DELTASONE) 10 MG tablet 4 tabs PO QD for 4 days; 3 tabs PO QD for 3 days; 2 tabs PO QD for 2 days; 1 tab PO QD for 1 day 08/02/13   Graylon Good, PA-C  predniSONE (DELTASONE) 20 MG tablet 3 tabs po daily x 3 days 05/11/14   Mercedes Camprubi-Soms, PA-C   BP 128/74 mmHg  Pulse 100  Temp(Src) 98.7 F (37.1 C) (Oral)  Resp 18  SpO2 100%    Physical Exam  Constitutional: He is oriented to person, place, and time. He appears well-developed and well-nourished.  HENT:  Head: Normocephalic and atraumatic.  Eyes: EOM are normal.  Neck: Normal range of motion.  Cardiovascular:  Normal rate and regular rhythm.   Pulmonary/Chest: Effort normal and breath sounds normal. No respiratory distress.  Abdominal: Soft.  Genitourinary: Testes normal and penis normal. Right testis shows no mass, no swelling and no tenderness. Left testis shows no mass, no swelling and no tenderness. Circumcised.  Chaperone present  Musculoskeletal: Normal range of motion.  Neurological: He is alert and oriented to person, place, and time.  Skin: Skin is warm and dry.  Psychiatric: He has a normal mood and affect.  Nursing note and vitals reviewed.   ED Course  Procedures  DIAGNOSTIC STUDIES: Oxygen Saturation is 100% on RA, normal by my interpretation.    COORDINATION OF  CARE: 1:22 PM Discussed next steps with pt. He understood and is agreeable to the plan.   Labs Review Labs Reviewed - No data to display  Imaging Review No results found. I have personally reviewed and evaluated these lab results as part of my medical decision-making.   EKG Interpretation None      MDM   Final diagnoses:  None  Patient presents today for STD testing.  He is asymptomatic at this time.  GC/Chlamydia, RPR, and HIV pending.  Patient stable for discharge.  Return precautions given.  I personally performed the services described in this documentation, which was scribed in my presence. The recorded information has been reviewed and is accurate.    Santiago Glad, PA-C 02/12/15 1357  Mancel Bale, MD 02/13/15 [phone]

## 2015-02-12 NOTE — ED Notes (Signed)
Pt states that he wants STD testing.  States that he went to the clinic and was told he needed to make an appt but states he is too busy to make an appt so he decided to come to the ER instead.  Pt has absolutely no symptoms whatsoever.  Just found out that "the girl he had been sleeping with lied to him".

## 2015-02-12 NOTE — Discharge Instructions (Signed)
You were tested for Syphilis, HIV, Gonorrhea, and Chlamydia today.  You will be called in 2 days if it comes back positive.

## 2015-02-13 LAB — GC/CHLAMYDIA PROBE AMP (~~LOC~~) NOT AT ARMC
CHLAMYDIA, DNA PROBE: POSITIVE — AB
NEISSERIA GONORRHEA: NEGATIVE

## 2015-02-13 LAB — HIV ANTIBODY (ROUTINE TESTING W REFLEX): HIV Screen 4th Generation wRfx: NONREACTIVE

## 2015-02-13 LAB — RPR: RPR Ser Ql: NONREACTIVE

## 2015-02-14 ENCOUNTER — Telehealth (HOSPITAL_BASED_OUTPATIENT_CLINIC_OR_DEPARTMENT_OTHER): Payer: Self-pay | Admitting: Emergency Medicine

## 2015-02-14 ENCOUNTER — Emergency Department (HOSPITAL_COMMUNITY)
Admission: EM | Admit: 2015-02-14 | Discharge: 2015-02-14 | Disposition: A | Discharge status: Home / Self Care | Payer: Medicaid Other | Attending: Physician Assistant | Admitting: Physician Assistant

## 2015-02-14 ENCOUNTER — Encounter (HOSPITAL_COMMUNITY): Payer: Self-pay

## 2015-02-14 DIAGNOSIS — F17210 Nicotine dependence, cigarettes, uncomplicated: Secondary | ICD-10-CM | POA: Insufficient documentation

## 2015-02-14 DIAGNOSIS — J45909 Unspecified asthma, uncomplicated: Secondary | ICD-10-CM | POA: Diagnosis not present

## 2015-02-14 DIAGNOSIS — A749 Chlamydial infection, unspecified: Secondary | ICD-10-CM | POA: Diagnosis not present

## 2015-02-14 DIAGNOSIS — Z202 Contact with and (suspected) exposure to infections with a predominantly sexual mode of transmission: Secondary | ICD-10-CM | POA: Diagnosis present

## 2015-02-14 DIAGNOSIS — Z79899 Other long term (current) drug therapy: Secondary | ICD-10-CM | POA: Insufficient documentation

## 2015-02-14 DIAGNOSIS — Z880 Allergy status to penicillin: Secondary | ICD-10-CM | POA: Insufficient documentation

## 2015-02-14 LAB — URINALYSIS, ROUTINE W REFLEX MICROSCOPIC
Bilirubin Urine: NEGATIVE
Glucose, UA: NEGATIVE mg/dL
HGB URINE DIPSTICK: NEGATIVE
Ketones, ur: NEGATIVE mg/dL
LEUKOCYTES UA: NEGATIVE
Nitrite: NEGATIVE
PROTEIN: NEGATIVE mg/dL
Specific Gravity, Urine: 1.028 (ref 1.005–1.030)
pH: 6.5 (ref 5.0–8.0)

## 2015-02-14 MED ORDER — ONDANSETRON HCL 4 MG PO TABS
4.0000 mg | ORAL_TABLET | Freq: Once | ORAL | Status: AC
  Administered 2015-02-14: 4 mg via ORAL
  Filled 2015-02-14: qty 1

## 2015-02-14 MED ORDER — AZITHROMYCIN 1 G PO PACK
1.0000 g | PACK | Freq: Once | ORAL | Status: AC
  Administered 2015-02-14: 1 g via ORAL
  Filled 2015-02-14: qty 1

## 2015-02-14 NOTE — ED Provider Notes (Signed)
CSN: [id number]     Arrival date & time 02/14/15  0757 History   First MD Initiated Contact with Patient 02/14/15 [phone]     Chief Complaint  Patient presents with  . SEXUALLY TRANSMITTED DISEASE     (Consider location/radiation/quality/duration/timing/severity/associated sxs/prior Treatment) HPI   QUINLIN CONANT is a 20 y.o. male with PMH significant for gonorrhea and reactive airway disease who presents with chlamydia.  Patient was seen 02/12/15 at [facility] ED for STD testing.  He was asymptomatic at the time; therefore, he did not receive empiric treatment.  He presents today with one episode of greenish penile discharge this morning and positive for chlamydia.  He reports he has not been sexually active since being tested.  He reports one episode of dark urine and testicular "heaviness".  He denies fever, chills, abdominal pain, N/V, dysuria, penile pain, or testicular swelling/redness.  His symptoms are mild, intermittent, and worsening.    Past Medical History  Diagnosis Date  . Reactive airway disease    No past surgical history on file. Family History  Problem Relation Age of Onset  . Hypertension Father   . Hypertension Paternal Grandfather    Social History  Substance Use Topics  . Smoking status: Current Every Day Smoker -- 0.30 packs/day    Types: Cigarettes  . Smokeless tobacco: Never Used  . Alcohol Use: Yes     Comment: socially    Review of Systems All other systems negative unless otherwise stated in HPI    Allergies  Penicillins  Home Medications   Prior to Admission medications   Medication Sig Start Date End Date Taking? Authorizing Provider  albuterol (PROVENTIL HFA;VENTOLIN HFA) 108 (90 BASE) MCG/ACT inhaler Inhale 1-2 puffs into the lungs every 4 (four) hours as needed for wheezing or shortness of breath. 08/02/13   Graylon Good, PA-C  azithromycin (ZITHROMAX Z-PAK) 250 MG tablet Use as directed 08/02/13   Graylon Good, PA-C  benzonatate (TESSALON)  200 MG capsule Take 1 capsule (200 mg total) by mouth 3 (three) times daily as needed for cough. 05/28/13   Marisa Severin, MD  doxycycline (VIBRAMYCIN) 100 MG capsule Take 1 capsule (100 mg total) by mouth 2 (two) times daily. One po bid x 7 days 09/25/14   Trixie Dredge, PA-C  Fexofenadine HCl (ALLEGRA PO) Take 1 tablet by mouth once a week. Days of the week vary    Historical Provider, MD  ibuprofen (ADVIL,MOTRIN) 800 MG tablet Take 800 mg by mouth 3 (three) times daily as needed for moderate pain.    Historical Provider, MD  meloxicam (MOBIC) 15 MG tablet Take 1 tablet (15 mg total) by mouth daily. 03/13/14   Reuben Likes, MD  predniSONE (DELTASONE) 10 MG tablet 4 tabs PO QD for 4 days; 3 tabs PO QD for 3 days; 2 tabs PO QD for 2 days; 1 tab PO QD for 1 day 08/02/13   Graylon Good, PA-C  predniSONE (DELTASONE) 20 MG tablet 3 tabs po daily x 3 days 05/11/14   Mercedes Camprubi-Soms, PA-C   BP 136/86 mmHg  Pulse 89  Temp(Src) 98 F (36.7 C) (Oral)  Resp 16  SpO2 98% Physical Exam  Constitutional: He is oriented to person, place, and time. He appears well-developed and well-nourished.  Non-toxic appearance. He does not have a sickly appearance. He does not appear ill.  HENT:  Head: Normocephalic and atraumatic.  Mouth/Throat: Oropharynx is clear and moist.  Eyes: Conjunctivae are normal.  Neck: Normal  range of motion. Neck supple.  Cardiovascular: Normal rate, regular rhythm and normal heart sounds.   No murmur heard. Pulmonary/Chest: Effort normal and breath sounds normal. No accessory muscle usage or stridor. No respiratory distress. He has no wheezes. He has no rhonchi. He has no rales.  Abdominal: Soft. Bowel sounds are normal. He exhibits no distension. There is no tenderness.  Genitourinary: Testes normal and penis normal. Right testis shows no mass, no swelling and no tenderness. Left testis shows no mass, no swelling and no tenderness. No penile erythema or penile tenderness. No discharge  found.  Chaperone present during exam.   Musculoskeletal: Normal range of motion.  Lymphadenopathy:    He has no cervical adenopathy.  Neurological: He is alert and oriented to person, place, and time.  Speech clear without dysarthria.  Skin: Skin is warm and dry.  Psychiatric: He has a normal mood and affect. His behavior is normal.    ED Course  Procedures (including critical care time) Labs Review Labs Reviewed  URINALYSIS, ROUTINE W REFLEX MICROSCOPIC (NOT AT [facility])    Imaging Review No results found. I have personally reviewed and evaluated these images and lab results as part of my medical decision-making.   EKG Interpretation None      MDM   Final diagnoses:  Chlamydia    Patient presents with positive cultures for chlamydia from visit on 02/12/15.  Negative for RPR, HIV, and gonorrhea.  One episode of dark urine.  UA negative.  No systemic symptoms.  No penile discharge, erythema, swelling or testicular tenderness, swelling, or erythema.  Patient treated with 1g azithromycin and zofran in ED.  Patient informed to discuss results with all sexual partners, and abstain from sex for 7 days or until symptoms have resolved.  Follow up PCP. Discussed return precautions.  Patient agrees and acknowledges the above plan for discharge.      Cheri Fowler, PA-C 02/14/15 1610  Courteney Randall An, MD 02/16/15 1539

## 2015-02-14 NOTE — ED Notes (Signed)
He states today he noted he noted a sm. Amt. Of "greenish" urethral d/c.  He states that he underwent testing at a [facility] department a few days ago, and has a paper from them which states he was found to be positive for (only) chlamydia.  He denies fever, nor any other sign of current illness.

## 2015-02-14 NOTE — Discharge Instructions (Signed)
Chlamydia, Male Chlamydia is an infection. It is spread through sexual contact. Chlamydia can be in different areas of the body. These areas include the urethra, throat, or rectum. It is important to treat chlamydia as soon as possible. It can damage other organs.  CAUSES  Chlamydia is caused by bacteria. It is a sexually transmitted disease. This means that it is passed from an infected partner during intimate contact. This contact could be with the genitals, mouth, or rectal area.  SIGNS AND SYMPTOMS  There may not be any symptoms. This is often the case early in the infection. If there are symptoms, they are usually mild and may only be noticeable in the morning. Symptoms you may notice include:   Burning with urination.  Pain or swelling in the testicles.  Watery mucus-like discharge from the penis.  Long-standing (chronic) pelvic pain after frequent infections.  Pain, swelling, or itching around the anus.  A sore throat.  Itching, burning, or redness in the eyes, or discharge from the eyes. DIAGNOSIS  To diagnose this infection, your health care provider will do a pelvic exam. A sample of urine or a swab from the rectum may be taken for testing.  TREATMENT  Chlamydia is treated with antibiotic medicines. Your health care provider may test you for infection again 3 months after treatment. HOME CARE INSTRUCTIONS  Take your antibiotic medicine as directed by your health care provider. Finish the antibiotic even if you start to feel better. Incomplete treatment will put you at risk for not being able to have children (sterility).   Take medicines only as directed by your health care provider.   Rest.   Inform any sexual partners about your infection. Even if they are symptom free or have a negative culture or evaluation, they should be treated for the condition.   Do not have sex (intercourse) until treatment is completed and your health care provider says it is okay.   Keep  all follow-up visits as directed by your health care provider.   Not all test results are available during your visit. If your test results are not back during the visit, make an appointment with your health care provider to find out the results. Do not assume everything is normal if you have not heard from your health care provider or the medical facility. It is your responsibility to get your test results. SEEK MEDICAL CARE IF:  You develop new joint pain.  You have a fever. SEEK IMMEDIATE MEDICAL CARE IF:   Your pain increases.   You have abnormal discharge.   You have pain during intercourse. MAKE SURE YOU:   Understand these instructions.  Will watch your condition.  Will get help right away if you are not doing well or get worse.   This information is not intended to replace advice given to you by your health care provider. Make sure you discuss any questions you have with your health care provider.   Document Released: 12/30/2004 Document Revised: 01/20/2014 Document Reviewed: 07/08/2012 Elsevier Interactive Patient Education 2016 Elsevier Inc.  

## 2015-02-24 ENCOUNTER — Emergency Department (HOSPITAL_COMMUNITY)
Admission: EM | Admit: 2015-02-24 | Discharge: 2015-02-24 | Disposition: A | Discharge status: Home / Self Care | Payer: Medicaid Other | Attending: Emergency Medicine | Admitting: Emergency Medicine

## 2015-02-24 ENCOUNTER — Encounter (HOSPITAL_COMMUNITY): Payer: Self-pay | Admitting: Emergency Medicine

## 2015-02-24 DIAGNOSIS — Z79899 Other long term (current) drug therapy: Secondary | ICD-10-CM | POA: Insufficient documentation

## 2015-02-24 DIAGNOSIS — J45909 Unspecified asthma, uncomplicated: Secondary | ICD-10-CM | POA: Insufficient documentation

## 2015-02-24 DIAGNOSIS — Z791 Long term (current) use of non-steroidal anti-inflammatories (NSAID): Secondary | ICD-10-CM | POA: Insufficient documentation

## 2015-02-24 DIAGNOSIS — Z8619 Personal history of other infectious and parasitic diseases: Secondary | ICD-10-CM | POA: Diagnosis not present

## 2015-02-24 DIAGNOSIS — Z880 Allergy status to penicillin: Secondary | ICD-10-CM | POA: Insufficient documentation

## 2015-02-24 DIAGNOSIS — Z09 Encounter for follow-up examination after completed treatment for conditions other than malignant neoplasm: Secondary | ICD-10-CM | POA: Insufficient documentation

## 2015-02-24 DIAGNOSIS — F17210 Nicotine dependence, cigarettes, uncomplicated: Secondary | ICD-10-CM | POA: Insufficient documentation

## 2015-02-24 NOTE — Discharge Instructions (Signed)
Sexually Transmitted Disease °A sexually transmitted disease (STD) is a disease or infection that may be passed (transmitted) from person to person, usually during sexual activity. This may happen by way of saliva, semen, blood, vaginal mucus, or urine. Common STDs include: °· Gonorrhea. °· Chlamydia. °· Syphilis. °· HIV and AIDS. °· Genital herpes. °· Hepatitis B and C. °· Trichomonas. °· Human papillomavirus (HPV). °· Pubic lice. °· Scabies. °· Mites. °· Bacterial vaginosis. °WHAT ARE CAUSES OF STDs? °An STD may be caused by bacteria, a virus, or parasites. STDs are often transmitted during sexual activity if one person is infected. However, they may also be transmitted through nonsexual means. STDs may be transmitted after:  °· Sexual intercourse with an infected person. °· Sharing sex toys with an infected person. °· Sharing needles with an infected person or using unclean piercing or tattoo needles. °· Having intimate contact with the genitals, mouth, or rectal areas of an infected person. °· Exposure to infected fluids during birth. °WHAT ARE THE SIGNS AND SYMPTOMS OF STDs? °Different STDs have different symptoms. Some people may not have any symptoms. If symptoms are present, they may include: °· Painful or bloody urination. °· Pain in the pelvis, abdomen, vagina, anus, throat, or eyes. °· A skin rash, itching, or irritation. °· Growths, ulcerations, blisters, or sores in the genital and anal areas. °· Abnormal vaginal discharge with or without bad odor. °· Penile discharge in men. °· Fever. °· Pain or bleeding during sexual intercourse. °· Swollen glands in the groin area. °· Yellow skin and eyes (jaundice). This is seen with hepatitis. °· Swollen testicles. °· Infertility. °· Sores and blisters in the mouth. °HOW ARE STDs DIAGNOSED? °To make a diagnosis, your health care provider may: °· Take a medical history. °· Perform a physical exam. °· Take a sample of any discharge to examine. °· Swab the throat,  cervix, opening to the penis, rectum, or vagina for testing. °· Test a sample of your first morning urine. °· Perform blood tests. °· Perform a Pap test, if this applies. °· Perform a colposcopy. °· Perform a laparoscopy. °HOW ARE STDs TREATED? °Treatment depends on the STD. Some STDs may be treated but not cured. °· Chlamydia, gonorrhea, trichomonas, and syphilis can be cured with antibiotic medicine. °· Genital herpes, hepatitis, and HIV can be treated, but not cured, with prescribed medicines. The medicines lessen symptoms. °· Genital warts from HPV can be treated with medicine or by freezing, burning (electrocautery), or surgery. Warts may come back. °· HPV cannot be cured with medicine or surgery. However, abnormal areas may be removed from the cervix, vagina, or vulva. °· If your diagnosis is confirmed, your recent sexual partners need treatment. This is true even if they are symptom-free or have a negative culture or evaluation. They should not have sex until their health care providers say it is okay. °· Your health care provider may test you for infection again 3 months after treatment. °HOW CAN I REDUCE MY RISK OF GETTING AN STD? °Take these steps to reduce your risk of getting an STD: °· Use latex condoms, dental dams, and water-soluble lubricants during sexual activity. Do not use petroleum jelly or oils. °· Avoid having multiple sex partners. °· Do not have sex with someone who has other sex partners °· Do not have sex with anyone you do not know or who is at high risk for an STD. °· Avoid risky sex practices that can break your skin. °· Do not have sex   if you have open sores on your mouth or skin. °· Avoid drinking too much alcohol or taking illegal drugs. Alcohol and drugs can affect your judgment and put you in a vulnerable position. °· Avoid engaging in oral and anal sex acts. °· Get vaccinated for HPV and hepatitis. If you have not received these vaccines in the past, talk to your health care  provider about whether one or both might be right for you. °· If you are at risk of being infected with HIV, it is recommended that you take a prescription medicine daily to prevent HIV infection. This is called pre-exposure prophylaxis (PrEP). You are considered at risk if: °¨ You are a man who has sex with other men (MSM). °¨ You are a heterosexual man or woman and are sexually active with more than one partner. °¨ You take drugs by injection. °¨ You are sexually active with a partner who has HIV. °· Talk with your health care provider about whether you are at high risk of being infected with HIV. If you choose to begin PrEP, you should first be tested for HIV. You should then be tested every 3 months for as long as you are taking PrEP. °WHAT SHOULD I DO IF I THINK I HAVE AN STD? °· See your health care provider. °· Tell your sexual partner(s). They should be tested and treated for any STDs. °· Do not have sex until your health care provider says it is okay. °WHEN SHOULD I GET IMMEDIATE MEDICAL CARE? °Contact your health care provider right away if:  °· You have severe abdominal pain. °· You are a man and notice swelling or pain in your testicles. °· You are a woman and notice swelling or pain in your vagina. °  °This information is not intended to replace advice given to you by your health care provider. Make sure you discuss any questions you have with your health care provider. °  °Document Released: 03/22/2002 Document Revised: 01/20/2014 Document Reviewed: 07/20/2012 °Elsevier Interactive Patient Education ©2016 Elsevier Inc. ° °

## 2015-02-24 NOTE — ED Notes (Signed)
Patient is alert and oriented x3.  He was given DC instructions and follow up visit instructions.  Patient gave verbal understanding.  He was DC ambulatory under his own power to home.  V/S stable.  He was not showing any signs of distress on DC 

## 2015-02-24 NOTE — ED Provider Notes (Signed)
CSN: [id number]     Arrival date & time 02/24/15  1200 History  By signing my name below, I, [facility], attest that this documentation has been prepared under the direction and in the presence of Cheri Fowler, PA-C. Electronically Signed: Randell Patient, ED Scribe. 02/24/2015. 1:29 PM.   Chief Complaint  Patient presents with  . Follow-up   The history is provided by the patient. No language interpreter was used.   HPI Comments: Neil Bryan is a 20 y.o. male with a PMHx of gonorrhea, reactive airway disease, and chlamydia who presents to the Emergency Department for a follow-up after a chlamydia diagnosis 12 days ago. Patient reports that he was seen and treated 12 days ago with resolution of his symptoms.  He states that he has intermittent, mild, aching testicular pain and discomfort are still present, lasting for 30 minutes at a time and alternating sides, and less severe than past pain. He states that pain presents when he stands for long periods of time at work and when driving. He denies recent sexual activity. He denies scrotal swelling and redness, dysuria, painful discharge, fever, chills, and abdominal pain currently.   Past Medical History  Diagnosis Date  . Reactive airway disease    History reviewed. No pertinent past surgical history. Family History  Problem Relation Age of Onset  . Hypertension Father   . Hypertension Paternal Grandfather    Social History  Substance Use Topics  . Smoking status: Current Every Day Smoker -- 0.30 packs/day    Types: Cigarettes  . Smokeless tobacco: Never Used  . Alcohol Use: Yes     Comment: socially    Review of Systems All other systems negative except as noted in HPI.   Allergies  Penicillins  Home Medications   Prior to Admission medications   Medication Sig Start Date End Date Taking? Authorizing Provider  albuterol (PROVENTIL HFA;VENTOLIN HFA) 108 (90 BASE) MCG/ACT inhaler Inhale 1-2 puffs into the lungs  every 4 (four) hours as needed for wheezing or shortness of breath. 08/02/13   Graylon Good, PA-C  azithromycin (ZITHROMAX Z-PAK) 250 MG tablet Use as directed 08/02/13   Graylon Good, PA-C  benzonatate (TESSALON) 200 MG capsule Take 1 capsule (200 mg total) by mouth 3 (three) times daily as needed for cough. 05/28/13   Marisa Severin, MD  doxycycline (VIBRAMYCIN) 100 MG capsule Take 1 capsule (100 mg total) by mouth 2 (two) times daily. One po bid x 7 days 09/25/14   Trixie Dredge, PA-C  Fexofenadine HCl (ALLEGRA PO) Take 1 tablet by mouth once a week. Days of the week vary    Historical Provider, MD  ibuprofen (ADVIL,MOTRIN) 800 MG tablet Take 800 mg by mouth 3 (three) times daily as needed for moderate pain.    Historical Provider, MD  meloxicam (MOBIC) 15 MG tablet Take 1 tablet (15 mg total) by mouth daily. 03/13/14   Reuben Likes, MD  predniSONE (DELTASONE) 10 MG tablet 4 tabs PO QD for 4 days; 3 tabs PO QD for 3 days; 2 tabs PO QD for 2 days; 1 tab PO QD for 1 day 08/02/13   Graylon Good, PA-C  predniSONE (DELTASONE) 20 MG tablet 3 tabs po daily x 3 days 05/11/14   Mercedes Camprubi-Soms, PA-C   BP 124/82 mmHg  Pulse 72  Temp(Src) 98.8 F (37.1 C)  Resp 18  SpO2 100% Physical Exam  Constitutional: He is oriented to person, place, and time. He appears well-developed  and well-nourished.  HENT:  Head: Normocephalic and atraumatic.  Right Ear: External ear normal.  Left Ear: External ear normal.  Eyes: Conjunctivae are normal. No scleral icterus.  Neck: No tracheal deviation present.  Cardiovascular: Normal rate and regular rhythm.   Pulmonary/Chest: Effort normal and breath sounds normal. No respiratory distress. He has no wheezes. He has no rales.  Abdominal: Soft. Bowel sounds are normal. He exhibits no distension. There is no tenderness. There is no rebound. Hernia confirmed negative in the right inguinal area and confirmed negative in the left inguinal area.  Genitourinary: Prostate  normal, testes normal and penis normal. Right testis shows no mass, no swelling and no tenderness. Left testis shows no mass, no swelling and no tenderness. No penile tenderness.  Musculoskeletal: Normal range of motion.  Neurological: He is alert and oriented to person, place, and time.  Skin: Skin is warm and dry.  Psychiatric: He has a normal mood and affect. His behavior is normal.    ED Course  Procedures   DIAGNOSTIC STUDIES: Oxygen Saturation is 98% on RA, normal by my interpretation.    COORDINATION OF CARE: 12:28 PM Discussed ordering an [place] of scrotum and pt declined. Advised pt to take ibuprofen as needed. Advised pt to follow-up with PCP. Advised pt to return to ED if scrotal swelling or redness presents. Discussed treatment plan with pt at bedside and pt agreed to plan.  Labs Review Labs Reviewed - No data to display  Imaging Review No results found. I have personally reviewed and evaluated these images and lab results as part of my medical decision-making.   EKG Interpretation None      MDM   Final diagnoses:  Follow-up exam    Patient presents for follow up after receiving treatment for chlamydia 12 days ago.  VSS, NAD.  Patient reports resolution of his symptoms, but is now complaining of intermittent bilateral scrotal pain/heaviness.  No masses, erythema, warmth, tenderness, or inguinal hernia noted on exam.  No urinary symptoms.  No sexual intercourse since the last visit.  No indication for further lab work at this time.  Discussed scrotal ultrasound; however, I do not feel this is warranted at this time.  Doubt infectious etiology.  Doubt hernia. Recommend conservative treatment with ibuprofen and follow up with PCP.  Discussed return precautions.  Patient agrees and acknowledges the above plan for discharge.   I personally performed the services described in this documentation, which was scribed in my presence. The recorded information has been reviewed and is  accurate.   Cheri Fowler, PA-C 02/24/15 1330  Richardean Canal, MD 02/24/15 [phone]

## 2015-02-24 NOTE — ED Notes (Signed)
Per pt he was seen 10 days ago for positive chlamydia and completed treatment , he states he was told to follow up . Also reports persistent scrotum and penile discomfort. denies discharge nor dysuria.

## 2015-02-24 NOTE — ED Notes (Signed)
Patient is alert and oriented x4. He is complaining of an intermittent aching pain that alternates from on testicle to the other.  Patient denies any discharge or burning when urination.  On exam no swelling or masses noted.

## 2015-03-01 ENCOUNTER — Encounter (HOSPITAL_COMMUNITY): Payer: Self-pay

## 2015-03-01 ENCOUNTER — Emergency Department (INDEPENDENT_AMBULATORY_CARE_PROVIDER_SITE_OTHER)
Admission: EM | Admit: 2015-03-01 | Discharge: 2015-03-01 | Disposition: A | Discharge status: Home / Self Care | Payer: Medicaid Other | Source: Home / Self Care | Attending: Family Medicine | Admitting: Family Medicine

## 2015-03-01 DIAGNOSIS — H6692 Otitis media, unspecified, left ear: Secondary | ICD-10-CM | POA: Diagnosis not present

## 2015-03-01 MED ORDER — AZITHROMYCIN 250 MG PO TABS: 250.0000 mg | ORAL_TABLET | Freq: Every day | ORAL | Status: DC

## 2015-03-01 NOTE — Discharge Instructions (Signed)
Otitis Media, Adult °Otitis media is redness, soreness, and puffiness (swelling) in the space just behind your eardrum (middle ear). It may be caused by allergies or infection. It often happens along with a cold. °HOME CARE °· Take your medicine as told. Finish it even if you start to feel better. °· Only take over-the-counter or prescription medicines for pain, discomfort, or fever as told by your doctor. °· Follow up with your doctor as told. °GET HELP IF: °· You have otitis media only in one ear, or bleeding from your nose, or both. °· You notice a lump on your neck. °· You are not getting better in 3-5 days. °· You feel worse instead of better. °GET HELP RIGHT AWAY IF:  °· You have pain that is not helped with medicine. °· You have puffiness, redness, or pain around your ear. °· You get a stiff neck. °· You cannot move part of your face (paralysis). °· You notice that the bone behind your ear hurts when you touch it. °MAKE SURE YOU:  °· Understand these instructions. °· Will watch your condition. °· Will get help right away if you are not doing well or get worse. °  °This information is not intended to replace advice given to you by your health care provider. Make sure you discuss any questions you have with your health care provider. °  °Document Released: 06/18/2007 Document Revised: 01/20/2014 Document Reviewed: 07/27/2012 °Elsevier Interactive Patient Education ©2016 Elsevier Inc. ° ° °

## 2015-03-01 NOTE — ED Notes (Signed)
Pt has had congestion and productive cough since Monday Pt also has lower back pain Pt alert and oriented

## 2015-03-01 NOTE — ED Notes (Signed)
Pt has congestion and a productive cough since Monday Pt also has lower back pain Pt alert and oriented

## 2015-03-01 NOTE — ED Provider Notes (Signed)
CSN: [id number]     Arrival date & time 03/01/15  1541 History   First MD Initiated Contact with Patient 03/01/15 1623     Chief Complaint  Patient presents with  . Back Pain  . Cough   (Consider location/radiation/quality/duration/timing/severity/associated sxs/prior Treatment) HPI Left ear ache for 2 days pain score is 4 over-the-counter treatments without relief of symptoms. Feels very similar to previous infections he's had it as there is a young person. Past Medical History  Diagnosis Date  . Reactive airway disease    History reviewed. No pertinent past surgical history. Family History  Problem Relation Age of Onset  . Hypertension Father   . Hypertension Paternal Grandfather    Social History  Substance Use Topics  . Smoking status: Current Every Day Smoker -- 0.30 packs/day    Types: Cigarettes  . Smokeless tobacco: Never Used  . Alcohol Use: Yes     Comment: socially    Review of Systems Left earache, cold symptoms. Allergies  Penicillins  Home Medications   Prior to Admission medications   Medication Sig Start Date End Date Taking? Authorizing Provider  albuterol (PROVENTIL HFA;VENTOLIN HFA) 108 (90 BASE) MCG/ACT inhaler Inhale 1-2 puffs into the lungs every 4 (four) hours as needed for wheezing or shortness of breath. 08/02/13   Graylon Good, PA-C  azithromycin (ZITHROMAX Z-PAK) 250 MG tablet Take 1 tablet (250 mg total) by mouth daily. 03/01/15   Tharon Aquas, PA  benzonatate (TESSALON) 200 MG capsule Take 1 capsule (200 mg total) by mouth 3 (three) times daily as needed for cough. 05/28/13   Marisa Severin, MD  doxycycline (VIBRAMYCIN) 100 MG capsule Take 1 capsule (100 mg total) by mouth 2 (two) times daily. One po bid x 7 days 09/25/14   Trixie Dredge, PA-C  Fexofenadine HCl (ALLEGRA PO) Take 1 tablet by mouth once a week. Days of the week vary    Historical Provider, MD  ibuprofen (ADVIL,MOTRIN) 800 MG tablet Take 800 mg by mouth 3 (three) times daily as needed  for moderate pain.    Historical Provider, MD  meloxicam (MOBIC) 15 MG tablet Take 1 tablet (15 mg total) by mouth daily. 03/13/14   Reuben Likes, MD  predniSONE (DELTASONE) 10 MG tablet 4 tabs PO QD for 4 days; 3 tabs PO QD for 3 days; 2 tabs PO QD for 2 days; 1 tab PO QD for 1 day 08/02/13   Graylon Good, PA-C  predniSONE (DELTASONE) 20 MG tablet 3 tabs po daily x 3 days 05/11/14   Mercedes Camprubi-Soms, PA-C   Meds Ordered and Administered this Visit  Medications - No data to display  There were no vitals taken for this visit. No data found.   Physical Exam  Constitutional: He is oriented to person, place, and time. He appears well-developed and well-nourished.  HENT:  Head: Normocephalic and atraumatic.  Right Ear: External ear normal.  Nose: Nose normal.  Neck: Neck supple.  Cardiovascular: Normal rate and normal heart sounds.   Pulmonary/Chest: Effort normal and breath sounds normal. No respiratory distress.  Neurological: He is alert and oriented to person, place, and time.  Skin: Skin is warm.  Psychiatric: He has a normal mood and affect. His behavior is normal.    ED Course  Procedures (including critical care time)  Labs Review Labs Reviewed - No data to display  Imaging Review No results found.   Visual Acuity Review  Right Eye Distance:   Left Eye Distance:  Bilateral Distance:    Right Eye Near:   Left Eye Near:    Bilateral Near:         MDM   1. Acute left otitis media, recurrence not specified, unspecified otitis media type    Patient is advised to continue home symptomatic treatment. Prescription for azithromycin  sent pharmacy patient has indicated. Patient is advised that if there are new or worsening symptoms or attend the emergency department, or contact primary care provider. Instructions of care provided discharged home in stable condition. Return to work/school note provided.  THIS NOTE WAS GENERATED USING A VOICE RECOGNITION  SOFTWARE PROGRAM. ALL REASONABLE EFFORTS  WERE MADE TO PROOFREAD THIS DOCUMENT FOR ACCURACY.     Tharon Aquas, PA 03/01/15 1810

## 2015-03-03 ENCOUNTER — Telehealth (HOSPITAL_COMMUNITY): Payer: Self-pay

## 2015-03-03 NOTE — Telephone Encounter (Signed)
Unable to contact pt by mail or telephone. Unable to communicate lab results or treatment changes. 

## 2015-03-12 ENCOUNTER — Encounter (HOSPITAL_COMMUNITY): Payer: Self-pay | Admitting: Emergency Medicine

## 2015-03-12 ENCOUNTER — Emergency Department (INDEPENDENT_AMBULATORY_CARE_PROVIDER_SITE_OTHER)
Admission: EM | Admit: 2015-03-12 | Discharge: 2015-03-12 | Disposition: A | Discharge status: Home / Self Care | Payer: BLUE CROSS/BLUE SHIELD | Source: Home / Self Care | Attending: Family Medicine | Admitting: Family Medicine

## 2015-03-12 DIAGNOSIS — J069 Acute upper respiratory infection, unspecified: Secondary | ICD-10-CM

## 2015-03-12 DIAGNOSIS — H6983 Other specified disorders of Eustachian tube, bilateral: Secondary | ICD-10-CM

## 2015-03-12 DIAGNOSIS — T700XXA Otitic barotrauma, initial encounter: Secondary | ICD-10-CM

## 2015-03-12 DIAGNOSIS — R0982 Postnasal drip: Secondary | ICD-10-CM | POA: Diagnosis not present

## 2015-03-12 NOTE — Discharge Instructions (Signed)
Barotitis Media °Barotitis media is inflammation of your middle ear. This occurs when the auditory tube (eustachian tube) leading from the back of your nose (nasopharynx) to your eardrum is blocked. This blockage may result from a cold, environmental allergies, or an upper respiratory infection. Unresolved barotitis media may lead to damage or hearing loss (barotrauma), which may become permanent. °HOME CARE INSTRUCTIONS  °· Use medicines as recommended by your health care provider. Over-the-counter medicines will help unblock the canal and can help during times of air travel. °· Do not put anything into your ears to clean or unplug them. Eardrops will not be helpful. °· Do not swim, dive, or fly until your health care provider says it is all right to do so. If these activities are necessary, chewing gum with frequent, forceful swallowing may help. It is also helpful to hold your nose and gently blow to pop your ears for equalizing pressure changes. This forces air into the eustachian tube. °· Only take over-the-counter or prescription medicines for pain, discomfort, or fever as directed by your health care provider. °· A decongestant may be helpful in decongesting the middle ear and make pressure equalization easier. °SEEK MEDICAL CARE IF: °· You experience a serious form of dizziness in which you feel as if the room is spinning and you feel nauseated (vertigo). °· Your symptoms only involve one ear. °SEEK IMMEDIATE MEDICAL CARE IF:  °· You develop a severe headache, dizziness, or severe ear pain. °· You have bloody or pus-like drainage from your ears. °· You develop a fever. °· Your problems do not improve or become worse. °MAKE SURE YOU:  °· Understand these instructions. °· Will watch your condition. °· Will get help right away if you are not doing well or get worse. °  °This information is not intended to replace advice given to you by your health care provider. Make sure you discuss any questions you have with  your health care provider. °  °Document Released: 12/28/1999 Document Revised: 10/20/2012 Document Reviewed: 07/27/2012 °Elsevier Interactive Patient Education ©2016 [employer] ° °Upper Respiratory Infection, Adult °For nasal and head congestion may take Sudafed PE 10 mg every 4 hours as needed. °Saline nasal spray used frequently. °For drainage may use Allegra, Claritin or Zyrtec. If you need stronger medicine to stop drainage may take Chlor-Trimeton 2-4 mg every 4 hours. This may cause drowsiness. °Ibuprofen 600 mg every 6 hours as needed for pain, discomfort or fever. °Drink plenty of fluids and stay well-hydrated. ° °Most upper respiratory infections (URIs) are a viral infection of the air passages leading to the lungs. A URI affects the nose, throat, and upper air passages. The most common type of URI is nasopharyngitis and is typically referred to as "the common cold." °URIs run their course and usually go away on their own. Most of the time, a URI does not require medical attention, but sometimes a bacterial infection in the upper airways can follow a viral infection. This is called a secondary infection. Sinus and middle ear infections are common types of secondary upper respiratory infections. °Bacterial pneumonia can also complicate a URI. A URI can worsen asthma and chronic obstructive pulmonary disease (COPD). Sometimes, these complications can require emergency medical care and may be life threatening.  °CAUSES °Almost all URIs are caused by viruses. A virus is a type of germ and can spread from one person to another.  °RISKS FACTORS °You may be at risk for a URI if:  °· You smoke.   °·   You have chronic heart or lung disease. °· You have a weakened defense (immune) system.   °· You are very young or very old.   °· You have nasal allergies or asthma. °· You work in crowded or poorly ventilated areas. °· You work in health care facilities or schools. °SIGNS AND SYMPTOMS  °Symptoms typically develop 2-3  days after you come in contact with a cold virus. Most viral URIs last 7-10 days. However, viral URIs from the influenza virus (flu virus) can last 14-18 days and are typically more severe. Symptoms may include:  °· Runny or stuffy (congested) nose.   °· Sneezing.   °· Cough.   °· Sore throat.   °· Headache.   °· Fatigue.   °· Fever.   °· Loss of appetite.   °· Pain in your forehead, behind your eyes, and over your cheekbones (sinus pain). °· Muscle aches.   °DIAGNOSIS  °Your health care provider may diagnose a URI by: °· Physical exam. °· Tests to check that your symptoms are not due to another condition such as: °¨ Strep throat. °¨ Sinusitis. °¨ Pneumonia. °¨ Asthma. °TREATMENT  °A URI goes away on its own with time. It cannot be cured with medicines, but medicines may be prescribed or recommended to relieve symptoms. Medicines may help: °· Reduce your fever. °· Reduce your cough. °· Relieve nasal congestion. °HOME CARE INSTRUCTIONS  °· Take medicines only as directed by your health care provider.   °· Gargle warm saltwater or take cough drops to comfort your throat as directed by your health care provider. °· Use a warm mist humidifier or inhale steam from a shower to increase air moisture. This may make it easier to breathe. °· Drink enough fluid to keep your urine clear or pale yellow.   °· Eat soups and other clear broths and maintain good nutrition.   °· Rest as needed.   °· Return to work when your temperature has returned to normal or as your health care provider advises. You may need to stay home longer to avoid infecting others. You can also use a face mask and careful hand washing to prevent spread of the virus. °· Increase the usage of your inhaler if you have asthma.   °· Do not use any tobacco products, including cigarettes, chewing tobacco, or electronic cigarettes. If you need help quitting, ask your health care provider. °PREVENTION  °The best way to protect yourself from getting a cold is to  practice good hygiene.  °· Avoid oral or hand contact with people with cold symptoms.   °· Wash your hands often if contact occurs.   °There is no clear evidence that vitamin C, vitamin E, echinacea, or exercise reduces the chance of developing a cold. However, it is always recommended to get plenty of rest, exercise, and practice good nutrition.  °SEEK MEDICAL CARE IF:  °· You are getting worse rather than better.   °· Your symptoms are not controlled by medicine.   °· You have chills. °· You have worsening shortness of breath. °· You have brown or red mucus. °· You have yellow or brown nasal discharge. °· You have pain in your face, especially when you bend forward. °· You have a fever. °· You have swollen neck glands. °· You have pain while swallowing. °· You have white areas in the back of your throat. °SEEK IMMEDIATE MEDICAL CARE IF:  °· You have severe or persistent: °¨ Headache. °¨ Ear pain. °¨ Sinus pain. °¨ Chest pain. °· You have chronic lung disease and any of the following: °¨ Wheezing. °¨ Prolonged cough. °¨   Coughing up blood. °¨ A change in your usual mucus. °· You have a stiff neck. °· You have changes in your: °¨ Vision. °¨ Hearing. °¨ Thinking. °¨ Mood. °MAKE SURE YOU:  °· Understand these instructions. °· Will watch your condition. °· Will get help right away if you are not doing well or get worse. °  °This information is not intended to replace advice given to you by your health care provider. Make sure you discuss any questions you have with your health care provider. °  °Document Released: 06/25/2000 Document Revised: 05/16/2014 Document Reviewed: 04/06/2013 °Elsevier Interactive Patient Education ©2016 [employer] Inc[employer] ° °

## 2015-03-12 NOTE — ED Provider Notes (Signed)
CSN: [id number]     Arrival date & time 03/12/15  1754 History   First MD Initiated Contact with Patient 03/12/15 1933     Chief Complaint  Patient presents with  . URI  . Otitis Media   (Consider location/radiation/quality/duration/timing/severity/associated sxs/prior Treatment) HPI Comments: 20 year old male states he has had some cold symptoms since Valentine's Day. He did get a little better but more recently he has had right ear popping, cough and runny nose.  Patient is a 20 y.o. male presenting with URI.  URI Presenting symptoms: congestion, cough and rhinorrhea   Presenting symptoms: no ear pain, no fatigue, no fever and no sore throat   Associated symptoms: no neck pain and no wheezing     Past Medical History  Diagnosis Date  . Reactive airway disease    History reviewed. No pertinent past surgical history. Family History  Problem Relation Age of Onset  . Hypertension Father   . Hypertension Paternal Grandfather    Social History  Substance Use Topics  . Smoking status: Current Every Day Smoker -- 0.30 packs/day    Types: Cigarettes  . Smokeless tobacco: Never Used  . Alcohol Use: Yes     Comment: socially    Review of Systems  Constitutional: Negative for fever, chills, diaphoresis, activity change and fatigue.  HENT: Positive for congestion, postnasal drip and rhinorrhea. Negative for ear pain, facial swelling, sore throat and trouble swallowing.   Eyes: Negative for pain, discharge and redness.  Respiratory: Positive for cough. Negative for chest tightness, shortness of breath and wheezing.   Cardiovascular: Negative.   Gastrointestinal: Negative.   Musculoskeletal: Negative.  Negative for neck pain and neck stiffness.  Skin: Negative.   Neurological: Negative.     Allergies  Penicillins  Home Medications   Prior to Admission medications   Medication Sig Start Date End Date Taking? Authorizing Provider  albuterol (PROVENTIL HFA;VENTOLIN HFA) 108  (90 BASE) MCG/ACT inhaler Inhale 1-2 puffs into the lungs every 4 (four) hours as needed for wheezing or shortness of breath. 08/02/13   Graylon Good, PA-C  azithromycin (ZITHROMAX Z-PAK) 250 MG tablet Take 1 tablet (250 mg total) by mouth daily. 03/01/15   Tharon Aquas, PA  benzonatate (TESSALON) 200 MG capsule Take 1 capsule (200 mg total) by mouth 3 (three) times daily as needed for cough. 05/28/13   Marisa Severin, MD  doxycycline (VIBRAMYCIN) 100 MG capsule Take 1 capsule (100 mg total) by mouth 2 (two) times daily. One po bid x 7 days 09/25/14   Trixie Dredge, PA-C  Fexofenadine HCl (ALLEGRA PO) Take 1 tablet by mouth once a week. Days of the week vary    Historical Provider, MD  ibuprofen (ADVIL,MOTRIN) 800 MG tablet Take 800 mg by mouth 3 (three) times daily as needed for moderate pain.    Historical Provider, MD  meloxicam (MOBIC) 15 MG tablet Take 1 tablet (15 mg total) by mouth daily. 03/13/14   Reuben Likes, MD  predniSONE (DELTASONE) 10 MG tablet 4 tabs PO QD for 4 days; 3 tabs PO QD for 3 days; 2 tabs PO QD for 2 days; 1 tab PO QD for 1 day 08/02/13   Graylon Good, PA-C  predniSONE (DELTASONE) 20 MG tablet 3 tabs po daily x 3 days 05/11/14   Mercedes Camprubi-Soms, PA-C   Meds Ordered and Administered this Visit  Medications - No data to display  BP 124/84 mmHg  Pulse 97  Temp(Src) 98.4 F (36.9 C) (Oral)  Resp 16  SpO2 100% No data found.   Physical Exam  Constitutional: He is oriented to person, place, and time. He appears well-developed and well-nourished. No distress.  HENT:  Mouth/Throat: Oropharyngeal exudate present.  Bilateral TMs retracted right greater than left. No erythema. No apparent effusion. Oropharynx with minor erythema, clear PND and minor cobblestoning.  Eyes: Conjunctivae and EOM are normal.  Neck: Normal range of motion. Neck supple.  Cardiovascular: Normal rate, regular rhythm and normal heart sounds.   Pulmonary/Chest: Effort normal and breath sounds  normal. No respiratory distress. He has no wheezes.  Musculoskeletal: Normal range of motion. He exhibits no edema.  Lymphadenopathy:    He has no cervical adenopathy.  Neurological: He is alert and oriented to person, place, and time.  Skin: Skin is warm and dry. No rash noted.  Psychiatric: He has a normal mood and affect.  Nursing note reviewed.   ED Course  Procedures (including critical care time)  Labs Review Labs Reviewed - No data to display  Imaging Review No results found.   Visual Acuity Review  Right Eye Distance:   Left Eye Distance:   Bilateral Distance:    Right Eye Near:   Left Eye Near:    Bilateral Near:         MDM   1. URI (upper respiratory infection)   2. Barotitis media, initial encounter   3. ETD (eustachian tube dysfunction), bilateral   4. PND (post-nasal drip)    For nasal and head congestion may take Sudafed PE 10 mg every 4 hours as needed. Saline nasal spray used frequently. For drainage may use Allegra, Claritin or Zyrtec. If you need stronger medicine to stop drainage may take Chlor-Trimeton 2-4 mg every 4 hours. This may cause drowsiness. Ibuprofen 600 mg every 6 hours as needed for pain, discomfort or fever. Drink plenty of fluids and stay well-hydrated.     Hayden Rasmussen, NP 03/12/15 [phone]

## 2015-03-12 NOTE — ED Notes (Signed)
Reports being seen at [facility] ago, diagnosed with ear infection and treated with azithromycin.  Patient does not think he was ever better, just different.  Right ear popping and getting worse, blowing yellow secretions, stuffiness in head, denies fever.

## 2015-04-11 ENCOUNTER — Encounter (HOSPITAL_COMMUNITY): Payer: Self-pay | Admitting: Emergency Medicine

## 2015-04-11 ENCOUNTER — Emergency Department (INDEPENDENT_AMBULATORY_CARE_PROVIDER_SITE_OTHER)
Admission: EM | Admit: 2015-04-11 | Discharge: 2015-04-11 | Disposition: A | Discharge status: Home / Self Care | Payer: BLUE CROSS/BLUE SHIELD | Source: Home / Self Care | Attending: Family Medicine | Admitting: Family Medicine

## 2015-04-11 DIAGNOSIS — K4020 Bilateral inguinal hernia, without obstruction or gangrene, not specified as recurrent: Secondary | ICD-10-CM

## 2015-04-11 NOTE — Discharge Instructions (Signed)
Inguinal Hernia, Adult , Care After °Refer to this sheet in the next few weeks. These discharge instructions provide you with general information on caring for yourself after you leave the hospital. Your caregiver may also give you specific instructions. Your treatment has been planned according to the most current medical practices available, but unavoidable complications sometimes occur. If you have any problems or questions after discharge, please call your caregiver. °HOME CARE INSTRUCTIONS °· Put ice on the operative site. °¨ Put ice in a plastic bag. °¨ Place a towel between your skin and the bag. °¨ Leave the ice on for 15-20 minutes at a time, 03-04 times a day while awake. °· Change bandages (dressings) as directed. °· Keep the wound dry and clean. The wound may be washed gently with soap and water. Gently blot or dab the wound dry. It is okay to take showers 24 to 48 hours after surgery. Do not take baths, use swimming pools, or use hot tubs for 10 days, or as directed by your caregiver. °· Only take over-the-counter or prescription medicines for pain, discomfort, or fever as directed by your caregiver. °· Continue your normal diet as directed. °· Do not lift anything more than 10 pounds or play contact sports for 3 weeks, or as directed. °SEEK MEDICAL CARE IF: °· There is redness, swelling, or increasing pain in the wound. °· There is fluid (pus) coming from the wound. °· There is drainage from a wound lasting longer than 1 day. °· You have an oral temperature above 102° F (38.9° C). °· You notice a bad smell coming from the wound or dressing. °· The wound breaks open after the stitches (sutures) have been removed. °· You notice increasing pain in the shoulders (shoulder strap areas). °· You develop dizzy episodes or fainting while standing. °· You feel sick to your stomach (nauseous) or throw up (vomit). °SEEK IMMEDIATE MEDICAL CARE IF: °· You develop a rash. °· You have difficulty breathing. °· You  develop a reaction or have side effects to medicines you were given. °MAKE SURE YOU:  °· Understand these instructions. °· Will watch your condition. °· Will get help right away if you are not doing well or get worse. °  °This information is not intended to replace advice given to you by your health care provider. Make sure you discuss any questions you have with your health care provider. °  °Document Released: 01/30/2006 Document Revised: 01/20/2014 Document Reviewed: 07/03/2014 °Elsevier Interactive Patient Education ©2016 [employer] ° °

## 2015-04-11 NOTE — ED Provider Notes (Signed)
CSN: [id number]     Arrival date & time 04/11/15  1355 History   First MD Initiated Contact with Patient 04/11/15 1559     Chief Complaint  Patient presents with  . Groin Pain  . Testicle Pain   (Consider location/radiation/quality/duration/timing/severity/associated sxs/prior Treatment) HPI Pt with r>l groin pain, that has been episodic for the last 2 weeks. Denies any heavy lifting at work. Has not noticed a bulge. No problem with urination, dysuria. Sexually active. No discharge.  Past Medical History  Diagnosis Date  . Reactive airway disease    History reviewed. No pertinent past surgical history. Family History  Problem Relation Age of Onset  . Hypertension Father   . Hypertension Paternal Grandfather    Social History  Substance Use Topics  . Smoking status: Current Every Day Smoker -- 0.30 packs/day    Types: Cigarettes  . Smokeless tobacco: Never Used  . Alcohol Use: Yes     Comment: socially    Review of Systems Groin pain Allergies  Penicillins  Home Medications   Prior to Admission medications   Medication Sig Start Date End Date Taking? Authorizing Provider  albuterol (PROVENTIL HFA;VENTOLIN HFA) 108 (90 BASE) MCG/ACT inhaler Inhale 1-2 puffs into the lungs every 4 (four) hours as needed for wheezing or shortness of breath. 08/02/13   Graylon GoodZachary H Baker, PA-C  azithromycin (ZITHROMAX Z-PAK) 250 MG tablet Take 1 tablet (250 mg total) by mouth daily. 03/01/15   Tharon AquasFrank C Patrick, PA  benzonatate (TESSALON) 200 MG capsule Take 1 capsule (200 mg total) by mouth 3 (three) times daily as needed for cough. 05/28/13   Marisa Severinlga Otter, MD  doxycycline (VIBRAMYCIN) 100 MG capsule Take 1 capsule (100 mg total) by mouth 2 (two) times daily. One po bid x 7 days 09/25/14   Trixie DredgeEmily West, PA-C  Fexofenadine HCl (ALLEGRA PO) Take 1 tablet by mouth once a week. Days of the week vary    Historical Provider, MD  ibuprofen (ADVIL,MOTRIN) 800 MG tablet Take 800 mg by mouth 3 (three) times daily as  needed for moderate pain.    Historical Provider, MD  meloxicam (MOBIC) 15 MG tablet Take 1 tablet (15 mg total) by mouth daily. 03/13/14   Reuben Likesavid C Keller, MD  predniSONE (DELTASONE) 10 MG tablet 4 tabs PO QD for 4 days; 3 tabs PO QD for 3 days; 2 tabs PO QD for 2 days; 1 tab PO QD for 1 day 08/02/13   Graylon GoodZachary H Baker, PA-C  predniSONE (DELTASONE) 20 MG tablet 3 tabs po daily x 3 days 05/11/14   Mercedes Camprubi-Soms, PA-C   Meds Ordered and Administered this Visit  Medications - No data to display  BP 134/85 mmHg  Pulse 80  Temp(Src) 98.5 F (36.9 C) (Oral)  Resp 16  SpO2 100% No data found.   Physical Exam  Abdominal:  Small nontender hernias palpated in both inguinal canals.  No bulging of scrotum.    NURSES NOTES AND VITAL SIGNS REVIEWED. CONSTITUTIONAL: Well developed, well nourished, no acute distress HEENT: normocephalic, atraumatic, right and left TM's are normal EYES: Conjunctiva normal NECK:normal ROM, supple, no adenopathy PULMONARY:No respiratory distress, normal effort, Lungs: CTAb/l, no wheezes, or increased work of breathing CARDIOVASCULAR: RRR, no murmur ABDOMEN: soft, ND, NT, +'ve BS MUSCULOSKELETAL: Normal ROM of all extremities,  SKIN: warm and dry without rash PSYCHIATRIC: Mood and affect, behavior are normal  ED Course  Procedures (including critical care time)  Labs Review Labs Reviewed - No data to display  Imaging Review No results found.   Visual Acuity Review  Right Eye Distance:   Left Eye Distance:   Bilateral Distance:    Right Eye Near:   Left Eye Near:    Bilateral Near:       No emergent surgical referral is required at this time.   MDM   1. Bilateral inguinal hernia without obstruction or gangrene, recurrence not specified     Patient is reassured that there are no issues that require transfer to higher level of care at this time or additional tests. Patient is advised to continue home symptomatic treatment. Patient is  advised that if there are new or worsening symptoms to attend the emergency department, contact primary care provider, or return to UC. Instructions of care provided discharged home in stable condition. Return to work/school note provided.   THIS NOTE WAS GENERATED USING A VOICE RECOGNITION SOFTWARE PROGRAM. ALL REASONABLE EFFORTS  WERE MADE TO PROOFREAD THIS DOCUMENT FOR ACCURACY.  I have verbally reviewed the discharge instructions with the patient. A printed AVS was given to the patient.  All questions were answered prior to discharge.      Tharon Aquas, PA 04/11/15 [phone]

## 2015-04-11 NOTE — ED Notes (Signed)
Here with bilateral testicle tingling sensation, denies penile d/c Right groin knot with achy pain started 2 weeks ago Diagnosed and treated with Chlamydia in Feb.

## 2015-05-14 ENCOUNTER — Ambulatory Visit (INDEPENDENT_AMBULATORY_CARE_PROVIDER_SITE_OTHER): Payer: BLUE CROSS/BLUE SHIELD | Admitting: Family Medicine

## 2015-05-14 VITALS — BP 135/96 | HR 96 | Temp 98.4°F | Wt 202.0 lb

## 2015-05-14 DIAGNOSIS — N4889 Other specified disorders of penis: Secondary | ICD-10-CM

## 2015-05-14 DIAGNOSIS — Z202 Contact with and (suspected) exposure to infections with a predominantly sexual mode of transmission: Secondary | ICD-10-CM

## 2015-05-14 DIAGNOSIS — Z20828 Contact with and (suspected) exposure to other viral communicable diseases: Secondary | ICD-10-CM | POA: Diagnosis not present

## 2015-05-14 DIAGNOSIS — N50819 Testicular pain, unspecified: Secondary | ICD-10-CM | POA: Diagnosis not present

## 2015-05-14 MED ORDER — AZITHROMYCIN 250 MG PO TABS
500.0000 mg | ORAL_TABLET | Freq: Once | ORAL | Status: AC
  Administered 2015-05-14: 500 mg via ORAL

## 2015-05-14 MED ORDER — CEFTRIAXONE SODIUM 1 G IJ SOLR
250.0000 mg | Freq: Once | INTRAMUSCULAR | Status: AC
  Administered 2015-05-14: 250 mg via INTRAMUSCULAR

## 2015-05-14 NOTE — Assessment & Plan Note (Signed)
HIV, RPR, GC chlamydia today Treat for possible gonorrhea and chlamydia with azithromycin 1 g and ceftriaxone 250 mg IM in clinic today Follow-up testing and report as necessary

## 2015-05-14 NOTE — Assessment & Plan Note (Signed)
Small papule not consistent with syphilis or herpes Check HIV, RPR, GC chlamydia today Continue to monitor

## 2015-05-14 NOTE — Assessment & Plan Note (Signed)
Likely related to STD exposure Given time course, and benign testicular exam, no need currently for ultrasound or Dopplers Precepted with Dr. Gwendolyn GrantWalden

## 2015-05-14 NOTE — Addendum Note (Signed)
Addended by: Gilberto BetterSIMPSON, MICHELLE R on: 05/14/2015 05:53 PM   Modules accepted: Orders

## 2015-05-14 NOTE — Patient Instructions (Signed)
Come back if bumps spread, pain worsens or does not improve.  We will let you know about results of tests when available.  Make lab appt in the morning to have syphilis and HIV testing.  Safe Sex Safe sex is about reducing the risk of giving or getting a sexually transmitted disease (STD). STDs are spread through sexual contact involving the genitals, mouth, or rectum. Some STDs can be cured and others cannot. Safe sex can also prevent unintended pregnancies.  WHAT ARE SOME SAFE SEX PRACTICES?  Limit your sexual activity to only one partner who is having sex with only you.  Talk to your partner about his or her past partners, past STDs, and drug use.  Use a condom every time you have sexual intercourse. This includes vaginal, oral, and anal sexual activity. Both females and males should wear condoms during oral sex. Only use latex or polyurethane condoms and water-based lubricants. Using petroleum-based lubricants or oils to lubricate a condom will weaken the condom and increase the chance that it will break. The condom should be in place from the beginning to the end of sexual activity. Wearing a condom reduces, but does not completely eliminate, your risk of getting or giving an STD. STDs can be spread by contact with infected body fluids and skin.  Get vaccinated for hepatitis B and HPV.  Avoid alcohol and recreational drugs, which can affect your judgment. You may forget to use a condom or participate in high-risk sex.  For females, avoid douching after sexual intercourse. Douching can spread an infection farther into the reproductive tract.  Check your body for signs of sores, blisters, rashes, or unusual discharge. See your health care provider if you notice any of these signs.  Avoid sexual contact if you have symptoms of an infection or are being treated for an STD. If you or your partner has herpes, avoid sexual contact when blisters are present. Use condoms at all other times.  If you  are at risk of being infected with HIV, it is recommended that you take a prescription medicine daily to prevent HIV infection. This is called pre-exposure prophylaxis (PrEP). You are considered at risk if:  You are a man who has sex with other men (MSM).  You are a heterosexual man or woman who is sexually active with more than one partner.  You take drugs by injection.  You are sexually active with a partner who has HIV.  Talk with your health care provider about whether you are at high risk of being infected with HIV. If you choose to begin PrEP, you should first be tested for HIV. You should then be tested every 3 months for as long as you are taking PrEP.  See your health care provider for regular screenings, exams, and tests for other STDs. Before having sex with a new partner, each of you should be screened for STDs and should talk about the results with each other. WHAT ARE THE BENEFITS OF SAFE SEX?   There is less chance of getting or giving an STD.  You can prevent unwanted or unintended pregnancies.  By discussing safe sex concerns with your partner, you may increase feelings of intimacy, comfort, trust, and honesty between the two of you.   This information is not intended to replace advice given to you by your health care provider. Make sure you discuss any questions you have with your health care provider.   Document Released: 02/07/2004 Document Revised: 01/20/2014 Document Reviewed: 06/23/2011 Elsevier  Interactive Patient Education 2016 Elsevier Inc.  

## 2015-05-14 NOTE — Progress Notes (Signed)
   Subjective:   Neil Bryan is a healthy 20 y.o. male here for same day appt for penile issues  Bump on penis - on shaft, ventral side - looked like pimple  - tried to pop and small amount of pus came out - still looks like pimple - no other bumps - no fevers, penial discharge - sexually active with women - 1 partner for last 3 months - prev diagnosed with chlamydia and gonorrhea - treated - never had bumps like this in past - urethral opening feels irritated intermittently  Pain in testicles - intermittent for 1.5 months - hurt previously with chlamydia in 02/2015  Review of Systems:  Per HPI.   Social History: current smoker - 1/2 ppd  Objective:  BP 135/96 mmHg  Pulse 96  Temp(Src) 98.4 F (36.9 C) (Oral)  Wt 202 lb (91.627 kg)  Gen:  20 y.o. male in NAD HEENT: NCAT, MMM Abd: Soft, NTND, BS present, no guarding or organomegaly GU: small papule on dorsal side of penis near glans, diffuse testicular pain worsened by elevation of scrotum, small b/l inguinal hernias Ext: WWP, no edema MSK: no obvious deformities Neuro: Alert and oriented, speech normal     Assessment & Plan:     Neil PongKevin A Bryan is a 20 y.o. male here for possible STD exposure  Penile papules Small papule not consistent with syphilis or herpes Check HIV, RPR, GC chlamydia today Continue to monitor  Testicular pain Likely related to STD exposure Given time course, and benign testicular exam, no need currently for ultrasound or Dopplers Precepted with Dr. Gwendolyn GrantWalden  Possible exposure to STD HIV, RPR, GC chlamydia today Treat for possible gonorrhea and chlamydia with azithromycin 1 g and ceftriaxone 250 mg IM in clinic today Follow-up testing and report as necessary    Erasmo DownerAngela M Daris Aristizabal, MD MPH PGY-2,  [facility] 05/14/2015  5:32 PM

## 2015-05-15 ENCOUNTER — Other Ambulatory Visit: Payer: BLUE CROSS/BLUE SHIELD

## 2015-05-15 DIAGNOSIS — Z202 Contact with and (suspected) exposure to infections with a predominantly sexual mode of transmission: Secondary | ICD-10-CM

## 2015-05-15 DIAGNOSIS — Z20828 Contact with and (suspected) exposure to other viral communicable diseases: Secondary | ICD-10-CM

## 2015-05-15 LAB — URINE CYTOLOGY ANCILLARY ONLY
Chlamydia: NEGATIVE
Neisseria Gonorrhea: NEGATIVE

## 2015-05-16 LAB — RPR

## 2015-05-16 LAB — HIV ANTIBODY (ROUTINE TESTING W REFLEX): HIV 1&2 Ab, 4th Generation: NONREACTIVE

## 2015-05-17 ENCOUNTER — Telehealth: Payer: Self-pay | Admitting: Family Medicine

## 2015-05-17 NOTE — Telephone Encounter (Signed)
Please call patient and inform him that HIV and RPR negative.  GC/CT also negative, but urine sample provided was too much urine and makes test inaccurate.  He was already treated regardless and should still inform sexual partners.  Erasmo DownerAngela M Bacigalupo, MD, MPH PGY-2,  [facility] 05/17/2015 6:40 PM

## 2015-05-20 ENCOUNTER — Ambulatory Visit (HOSPITAL_COMMUNITY)
Admission: EM | Admit: 2015-05-20 | Discharge: 2015-05-20 | Disposition: A | Discharge status: Home / Self Care | Payer: BLUE CROSS/BLUE SHIELD | Attending: Emergency Medicine | Admitting: Emergency Medicine

## 2015-05-20 ENCOUNTER — Encounter (HOSPITAL_COMMUNITY): Payer: Self-pay | Admitting: Nurse Practitioner

## 2015-05-20 DIAGNOSIS — N50819 Testicular pain, unspecified: Secondary | ICD-10-CM | POA: Diagnosis not present

## 2015-05-20 LAB — POCT URINALYSIS DIP (DEVICE)
Bilirubin Urine: NEGATIVE
GLUCOSE, UA: NEGATIVE mg/dL
Hgb urine dipstick: NEGATIVE
Ketones, ur: NEGATIVE mg/dL
LEUKOCYTES UA: NEGATIVE
NITRITE: NEGATIVE
PROTEIN: NEGATIVE mg/dL
SPECIFIC GRAVITY, URINE: 1.02 (ref 1.005–1.030)
UROBILINOGEN UA: 0.2 mg/dL (ref 0.0–1.0)
pH: 6.5 (ref 5.0–8.0)

## 2015-05-20 MED ORDER — DOXYCYCLINE HYCLATE 100 MG PO CAPS: 100.0000 mg | ORAL_CAPSULE | Freq: Two times a day (BID) | ORAL | Status: DC

## 2015-05-20 NOTE — ED Provider Notes (Signed)
CSN: [id number]     Arrival date & time 05/20/15  1541 History   None    Chief Complaint  Patient presents with  . Groin Pain   (Consider location/radiation/quality/duration/timing/severity/associated sxs/prior Treatment)  HPI   Patient is a 20 year old male presenting today with complaints of left testicular/groin pain following riding in the car for 3 hours today. Patient states he was seen here approximately 2 weeks ago for similar complaint and was told he had bilateral inguinal hernias. Denies significant medical history denies fever or other generalized malaise.  Past Medical History  Diagnosis Date  . Reactive airway disease    History reviewed. No pertinent past surgical history. Family History  Problem Relation Age of Onset  . Hypertension Father   . Hypertension Paternal Grandfather    Social History  Substance Use Topics  . Smoking status: Current Every Day Smoker -- 0.30 packs/day    Types: Cigarettes  . Smokeless tobacco: Never Used  . Alcohol Use: Yes     Comment: socially    Review of Systems  Constitutional: Negative.   HENT: Negative.   Eyes: Negative.   Respiratory: Negative.   Cardiovascular: Negative.   Gastrointestinal: Negative.   Endocrine: Negative.   Genitourinary: Positive for testicular pain. Negative for dysuria, urgency, frequency, hematuria, flank pain, decreased urine volume, discharge, penile swelling, scrotal swelling, difficulty urinating, genital sores and penile pain.  Musculoskeletal: Negative.   Skin: Negative.   Allergic/Immunologic: Negative.   Neurological: Negative.   Hematological: Negative.   Psychiatric/Behavioral: Negative.     Allergies  Penicillins  Home Medications   Prior to Admission medications   Medication Sig Start Date End Date Taking? Authorizing Provider  albuterol (PROVENTIL HFA;VENTOLIN HFA) 108 (90 BASE) MCG/ACT inhaler Inhale 1-2 puffs into the lungs every 4 (four) hours as needed for wheezing or  shortness of breath. 08/02/13   Graylon GoodZachary H Baker, PA-C  benzonatate (TESSALON) 200 MG capsule Take 1 capsule (200 mg total) by mouth 3 (three) times daily as needed for cough. 05/28/13   Marisa Severinlga Otter, MD  doxycycline (VIBRAMYCIN) 100 MG capsule Take 1 capsule (100 mg total) by mouth 2 (two) times daily. 05/20/15   Servando Salinaatherine H Rossi, NP  Fexofenadine HCl (ALLEGRA PO) Take 1 tablet by mouth once a week. Days of the week vary    Historical Provider, MD  ibuprofen (ADVIL,MOTRIN) 800 MG tablet Take 800 mg by mouth 3 (three) times daily as needed for moderate pain.    Historical Provider, MD  meloxicam (MOBIC) 15 MG tablet Take 1 tablet (15 mg total) by mouth daily. 03/13/14   Reuben Likesavid C Keller, MD  predniSONE (DELTASONE) 10 MG tablet 4 tabs PO QD for 4 days; 3 tabs PO QD for 3 days; 2 tabs PO QD for 2 days; 1 tab PO QD for 1 day 08/02/13   Graylon GoodZachary H Baker, PA-C  predniSONE (DELTASONE) 20 MG tablet 3 tabs po daily x 3 days 05/11/14   Mercedes Camprubi-Soms, PA-C   Meds Ordered and Administered this Visit  Medications - No data to display  BP 134/77 mmHg  Pulse 88  Temp(Src) 98.9 F (37.2 C) (Oral)  Resp 16  SpO2 100% No data found.   Physical Exam  Constitutional: He appears well-developed and well-nourished. No distress.  Cardiovascular: Normal rate, regular rhythm, normal heart sounds and intact distal pulses.  Exam reveals no gallop and no friction rub.   No murmur heard. Pulmonary/Chest: Effort normal and breath sounds normal. No respiratory distress. He has no  wheezes. He has no rales. He exhibits no tenderness.  Genitourinary: Penis normal.    Cremasteric reflex is present. Right testis shows no mass, no swelling and no tenderness. Right testis is descended. Cremasteric reflex is not absent on the right side. Left testis shows tenderness. Left testis shows no mass and no swelling. Left testis is descended. Cremasteric reflex is not absent on the left side. No penile erythema or penile tenderness. No  discharge found.  Unable to discern previously documented inguinal hernias.   Lymphadenopathy:       Right: No inguinal adenopathy present.       Left: No inguinal adenopathy present.  Skin: Skin is warm and dry. He is not diaphoretic.  Nursing note and vitals reviewed.   ED Course  Procedures (including critical care time)  Labs Review Labs Reviewed  POCT URINALYSIS DIP (DEVICE)   Results for orders placed or performed during the hospital encounter of 05/20/15  POCT urinalysis dip (device)  Result Value Ref Range   Glucose, UA NEGATIVE NEGATIVE mg/dL   Bilirubin Urine NEGATIVE NEGATIVE   Ketones, ur NEGATIVE NEGATIVE mg/dL   Specific Gravity, Urine 1.020 1.005 - 1.030   Hgb urine dipstick NEGATIVE NEGATIVE   pH 6.5 5.0 - 8.0   Protein, ur NEGATIVE NEGATIVE mg/dL   Urobilinogen, UA 0.2 0.0 - 1.0 mg/dL   Nitrite NEGATIVE NEGATIVE   Leukocytes, UA NEGATIVE NEGATIVE    Imaging Review No results found.    MDM   1. Testicular pain    Meds ordered this encounter  Medications  . DISCONTD: doxycycline (VIBRAMYCIN) 100 MG capsule    Sig: Take 1 capsule (100 mg total) by mouth 2 (two) times daily.    Dispense:  20 capsule    Refill:  0  . doxycycline (VIBRAMYCIN) 100 MG capsule    Sig: Take 1 capsule (100 mg total) by mouth 2 (two) times daily.    Dispense:  20 capsule    Refill:  0    Plan of care was discussed with Dr. Artis Flock, possible epididymitis.  Patient to follow up with urology. The patient verbalizes understanding and agrees to plan of care.     Servando Salina, NP 05/20/15 Rickey Primus

## 2015-05-20 NOTE — Discharge Instructions (Signed)
Follow up with urology for testicular pain and possible epididymitis.  Epididymitis Epididymitis is swelling (inflammation) of the epididymis. The epididymis is a cord-like structure that is located along the top and back part of the testicle. It collects and stores sperm from the testicle. This condition can also cause pain and swelling of the testicle and scrotum. Symptoms usually start suddenly (acute epididymitis). Sometimes epididymitis starts gradually and lasts for a while (chronic epididymitis). This type may be harder to treat. CAUSES In men 5335 and younger, this condition is usually caused by a bacterial infection or sexually transmitted disease (STD), such as:  Gonorrhea.  Chlamydia.  In men 3935 and older who do not have anal sex, this condition is usually caused by bacteria from a blockage or abnormalities in the urinary system. These can result from:  Having a tube placed into the bladder (urinary catheter).  Having an enlarged or inflamed prostate gland.  Having recent urinary tract surgery. In men who have a condition that weakens the body's defense system (immune system), such as HIV, this condition can be caused by:   Other bacteria, including tuberculosis and syphilis.  Viruses.  Fungi. Sometimes this condition occurs without infection. That may happen if urine flows backward into the epididymis after heavy lifting or straining. RISK FACTORS This condition is more likely to develop in men:  Who have unprotected sex with more than one partner.  Who have anal sex.   Who have recently had surgery.   Who have a urinary catheter.  Who have urinary problems.  Who have a suppressed immune system. SYMPTOMS  This condition usually begins suddenly with chills, fever, and pain behind the scrotum and in the testicle. Other symptoms include:   Swelling of the scrotum, testicle, or both.  Pain whenejaculatingor urinating.  Pain in the back or  belly.  Nausea.  Itching and discharge from the penis.  Frequent need to pass urine.  Redness and tenderness of the scrotum. DIAGNOSIS Your health care provider can diagnose this condition based on your symptoms and medical history. Your health care provider will also do a physical exam to ask about your symptoms and check your scrotum and testicle for swelling, pain, and redness. You may also have other tests, including:   Examination of discharge from the penis.  Urine tests for infections, such as STDs.  Your health care provider may test you for other STDs, including HIV. TREATMENT Treatment for this condition depends on the cause. If your condition is caused by a bacterial infection, oral antibiotic medicine may be prescribed. If the bacterial infection has spread to your blood, you may need to receive IV antibiotics. Nonbacterial epididymitis is treated with home care that includes bed rest and elevation of the scrotum. Surgery may be needed to treat:  Bacterial epididymitis that causes pus to build up in the scrotum (abscess).  Chronic epididymitis that has not responded to other treatments. HOME CARE INSTRUCTIONS Medicines  Take over-the-counter and prescription medicines only as told by your health care provider.   If you were prescribed an antibiotic medicine, take it as told by your health care provider. Do not stop taking the antibiotic even if your condition improves. Sexual Activity  If your epididymitis was caused by an STD, avoid sexual activity until your treatment is complete.  Inform your sexual partner or partners if you test positive for an STD. They may need to be treated.Do not engage in sexual activity with your partner or partners until their treatment is  completed. General Instructions  Return to your normal activities as told by your health care provider. Ask your health care provider what activities are safe for you.  Keep your scrotum  elevated and supported while resting. Ask your health care provider if you should wear a scrotal support, such as a jockstrap. Wear it as told by your health care provider.  If directed, apply ice to the affected area:   Put ice in a plastic bag.  Place a towel between your skin and the bag.  Leave the ice on for 20 minutes, 2-3 times per day.  Try taking a sitz bath to help with discomfort. This is a warm water bath that is taken while you are sitting down. The water should only come up to your hips and should cover your buttocks. Do this 3-4 times per day or as told by your health care provider.  Keep all follow-up visits as told by your health care provider. This is important. SEEK MEDICAL CARE IF:   You have a fever.   Your pain medicine is not helping.   Your pain is getting worse.   Your symptoms do not improve within three days.   This information is not intended to replace advice given to you by your health care provider. Make sure you discuss any questions you have with your health care provider.   Document Released: 12/28/1999 Document Revised: 09/20/2014 Document Reviewed: 05/17/2014 Elsevier Interactive Patient Education [employer].

## 2015-05-20 NOTE — ED Notes (Signed)
Pt reports onset L groin pain after a 3 hour car ride. Describes as a constant pain with intermittent shooting pains. Denies any bowel or bladder changes. He tried ibuprofen with minimal relief.

## 2015-05-22 NOTE — Telephone Encounter (Signed)
Pt informed. Deseree Blount, CMA  

## 2015-09-15 ENCOUNTER — Encounter (HOSPITAL_COMMUNITY): Payer: Self-pay | Admitting: Emergency Medicine

## 2015-09-15 ENCOUNTER — Ambulatory Visit (HOSPITAL_COMMUNITY)
Admission: EM | Admit: 2015-09-15 | Discharge: 2015-09-15 | Disposition: A | Discharge status: Home / Self Care | Payer: BLUE CROSS/BLUE SHIELD | Attending: Physician Assistant | Admitting: Physician Assistant

## 2015-09-15 DIAGNOSIS — Z0000 Encounter for general adult medical examination without abnormal findings: Secondary | ICD-10-CM | POA: Diagnosis not present

## 2015-09-15 DIAGNOSIS — R519 Headache, unspecified: Secondary | ICD-10-CM

## 2015-09-15 DIAGNOSIS — R51 Headache: Secondary | ICD-10-CM

## 2015-09-15 LAB — SEDIMENTATION RATE: Sed Rate: 1 mm/hr (ref 0–16)

## 2015-09-15 LAB — C-REACTIVE PROTEIN

## 2015-09-15 MED ORDER — INDOMETHACIN 50 MG PO CAPS: 50.0000 mg | ORAL_CAPSULE | Freq: Two times a day (BID) | ORAL | 1 refills | Status: DC

## 2015-09-15 NOTE — ED Triage Notes (Signed)
[facility] a [facility] of off and on headaches x 2 weeks. The patient stated that he is NOT having any headache at this time.

## 2015-10-20 ENCOUNTER — Ambulatory Visit (HOSPITAL_COMMUNITY)
Admission: EM | Admit: 2015-10-20 | Discharge: 2015-10-20 | Disposition: A | Discharge status: Home / Self Care | Payer: BLUE CROSS/BLUE SHIELD | Attending: Internal Medicine | Admitting: Internal Medicine

## 2015-10-20 ENCOUNTER — Encounter (HOSPITAL_COMMUNITY): Payer: Self-pay | Admitting: Emergency Medicine

## 2015-10-20 DIAGNOSIS — J028 Acute pharyngitis due to other specified organisms: Secondary | ICD-10-CM | POA: Insufficient documentation

## 2015-10-20 DIAGNOSIS — J029 Acute pharyngitis, unspecified: Secondary | ICD-10-CM | POA: Diagnosis not present

## 2015-10-20 DIAGNOSIS — J069 Acute upper respiratory infection, unspecified: Secondary | ICD-10-CM

## 2015-10-20 DIAGNOSIS — Z880 Allergy status to penicillin: Secondary | ICD-10-CM | POA: Insufficient documentation

## 2015-10-20 DIAGNOSIS — F17210 Nicotine dependence, cigarettes, uncomplicated: Secondary | ICD-10-CM | POA: Insufficient documentation

## 2015-10-20 HISTORY — DX: Pneumonia, unspecified organism: J18.9

## 2015-10-20 LAB — POCT RAPID STREP A: Streptococcus, Group A Screen (Direct): NEGATIVE

## 2015-10-20 NOTE — Discharge Instructions (Signed)
For nasal and head congestion may take Sudafed PE 10 mg every 4 hours as needed. Saline nasal spray used frequently. For drainage may use Allegra, Claritin or Zyrtec. If you need stronger medicine to stop drainage may take Chlor-Trimeton 2-4 mg every 4 hours. This may cause drowsiness. Ibuprofen 600 mg every 6 hours as needed for pain, discomfort or fever. Drink plenty of fluids and stay well-hydrated. Flonase or Rhinocort nasal spray daily Cepacol lozenges for sore throat pain The throat swab will be cultured and if positive for strep will call and treat over the phone

## 2015-10-20 NOTE — ED Triage Notes (Signed)
Green/yellow phlegm.  Patient has a cough and sore throat.  Patient has not checked for a fever at home.  Denies ear pain denies headache today

## 2015-10-20 NOTE — ED Provider Notes (Signed)
CSN: [id number]     Arrival date & time 10/20/15  1404 History   First MD Initiated Contact with Patient 10/20/15 1717     Chief Complaint  Patient presents with  . Cough  . Sore Throat   (Consider location/radiation/quality/duration/timing/severity/associated sxs/prior Treatment) 20 year old male complains of sore throat since last night. He has pain in the throat associated with cough. He also has scant mucus green and yellow. Denies fever or chills. Denies GI symptoms.      Past Medical History:  Diagnosis Date  . Pneumonia   . Reactive airway disease    History reviewed. No pertinent surgical history. Family History  Problem Relation Age of Onset  . Hypertension Father   . Hypertension Paternal Grandfather    Social History  Substance Use Topics  . Smoking status: Current Every Day Smoker    Packs/day: 0.30    Types: Cigarettes  . Smokeless tobacco: Never Used  . Alcohol use Yes     Comment: socially    Review of Systems  Constitutional: Positive for activity change. Negative for diaphoresis, fatigue and fever.  HENT: Positive for postnasal drip and sore throat. Negative for ear pain, facial swelling, rhinorrhea and trouble swallowing.   Eyes: Negative for pain, discharge and redness.  Respiratory: Positive for cough. Negative for chest tightness and shortness of breath.   Cardiovascular: Negative.   Gastrointestinal: Negative.   Musculoskeletal: Negative.  Negative for neck pain and neck stiffness.  Neurological: Negative.   All other systems reviewed and are negative.   Allergies  Penicillins  Home Medications   Prior to Admission medications   Medication Sig Start Date End Date Taking? Authorizing Provider  Chlorphen-Phenyleph-ASA (ALKA-SELTZER PLUS COLD PO) Take by mouth.   Yes Historical Provider, MD  Aspirin-Salicylamide-Caffeine (BC HEADACHE POWDER PO) Take by mouth.    Historical Provider, MD  benzonatate (TESSALON) 200 MG capsule Take 1 capsule  (200 mg total) by mouth 3 (three) times daily as needed for cough. Patient not taking: Reported on 10/20/2015 05/28/13   Marisa Severinlga Otter, MD  doxycycline (VIBRAMYCIN) 100 MG capsule Take 1 capsule (100 mg total) by mouth 2 (two) times daily. Patient not taking: Reported on 10/20/2015 05/20/15   Servando Salinaatherine H Rossi, NP  Fexofenadine HCl (ALLEGRA PO) Take 1 tablet by mouth once a week. Days of the week vary    Historical Provider, MD  ibuprofen (ADVIL,MOTRIN) 800 MG tablet Take 800 mg by mouth 3 (three) times daily as needed for moderate pain.    Historical Provider, MD  indomethacin (INDOCIN) 50 MG capsule Take 1 capsule (50 mg total) by mouth 2 (two) times daily with a meal. Patient not taking: Reported on 10/20/2015 09/15/15   Tharon AquasFrank C Patrick, PA  meloxicam (MOBIC) 15 MG tablet Take 1 tablet (15 mg total) by mouth daily. Patient not taking: Reported on 10/20/2015 03/13/14   Reuben Likesavid C Keller, MD  predniSONE (DELTASONE) 10 MG tablet 4 tabs PO QD for 4 days; 3 tabs PO QD for 3 days; 2 tabs PO QD for 2 days; 1 tab PO QD for 1 day Patient not taking: Reported on 10/20/2015 08/02/13   Graylon GoodZachary H Baker, PA-C  predniSONE (DELTASONE) 20 MG tablet 3 tabs po daily x 3 days Patient not taking: Reported on 10/20/2015 05/11/14   Mercedes Camprubi-Soms, PA-C   Meds Ordered and Administered this Visit  Medications - No data to display  BP 130/79 (BP Location: Left Arm)   Pulse 98   Temp 98.9 F (37.2 C) (  Oral)   Resp 18   Ht 6\' 2"  (1.88 m)   Wt 210 lb (95.3 kg)   SpO2 100%   BMI 26.96 kg/m  No data found.   Physical Exam  Constitutional: He is oriented to person, place, and time. He appears well-developed and well-nourished. No distress.  HENT:  Right Ear: External ear normal.  Left Ear: External ear normal.  Mouth/Throat: Oropharyngeal exudate present.  Bilateral TMs are normal. Oropharynx with mild erythema and small exudates to left and right tonsils.  Eyes: EOM are normal. Pupils are equal, round, and reactive  to light.  Neck: Normal range of motion. Neck supple.  Cardiovascular: Normal rate, regular rhythm and normal heart sounds.   Pulmonary/Chest: Effort normal and breath sounds normal. No respiratory distress. He has no wheezes. He has no rales.  Musculoskeletal: Normal range of motion. He exhibits no edema.  Lymphadenopathy:    He has no cervical adenopathy.  Neurological: He is alert and oriented to person, place, and time.  Skin: Skin is warm and dry.  Nursing note and vitals reviewed.   Urgent Care Course   Clinical Course    Procedures (including critical care time)  Labs Review Labs Reviewed  POCT RAPID STREP A   Results for orders placed or performed during the hospital encounter of 10/20/15  POCT rapid strep A [facility])  Result Value Ref Range   Streptococcus, Group A Screen (Direct) NEGATIVE NEGATIVE    Imaging Review No results found.   Visual Acuity Review  Right Eye Distance:   Left Eye Distance:   Bilateral Distance:    Right Eye Near:   Left Eye Near:    Bilateral Near:         MDM   1. Upper respiratory tract infection, unspecified type   2. Viral pharyngitis    For nasal and head congestion may take Sudafed PE 10 mg every 4 hours as needed. Saline nasal spray used frequently. For drainage may use Allegra, Claritin or Zyrtec. If you need stronger medicine to stop drainage may take Chlor-Trimeton 2-4 mg every 4 hours. This may cause drowsiness. Ibuprofen 600 mg every 6 hours as needed for pain, discomfort or fever. Drink plenty of fluids and stay well-hydrated. Flonase or Rhinocort nasal spray daily Cepacol lozenges for sore throat pain The throat swab will be cultured and if positive for strep will call and treat over the phone    Hayden Rasmussen, NP 10/20/15 1753    Hayden Rasmussen, NP 10/20/15 1754

## 2015-10-23 LAB — CULTURE, GROUP A STREP (THRC)

## 2015-11-09 ENCOUNTER — Telehealth: Payer: Self-pay | Admitting: General Practice

## 2015-11-09 ENCOUNTER — Encounter: Payer: Self-pay | Admitting: Family Medicine

## 2015-11-09 ENCOUNTER — Ambulatory Visit (INDEPENDENT_AMBULATORY_CARE_PROVIDER_SITE_OTHER): Payer: BLUE CROSS/BLUE SHIELD | Admitting: Family Medicine

## 2015-11-09 VITALS — BP 136/80 | HR 76 | Temp 98.5°F | Wt 202.0 lb

## 2015-11-09 DIAGNOSIS — T148XXA Other injury of unspecified body region, initial encounter: Secondary | ICD-10-CM

## 2015-11-09 DIAGNOSIS — Z23 Encounter for immunization: Secondary | ICD-10-CM

## 2015-11-09 NOTE — Progress Notes (Signed)
    Subjective: RK:[id number]:stepped on a nail  HPI: Patient is a 20 y.o. male presenting to clinic today for a SDA.  On Wednesday he stepped on a nail at work. The nail went through his shoe and punctured the ball of his foot. He notes that there was a moderate amount of bleeding. He did not try to clean or wash the area as he was so tired. The next morning he washed his foot in the shower. There's a black dot where the nail went in now. There was a thin pink drainage when he expressed it.  No surrounding erythema. No fevers or chills. There is mild pain at the site of the puncture wound, but he notes that it was not too deep.  Last tetanus shot 2000.  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  Health Maintenance: Due for flu vaccine.  ROS: All other systems reviewed and are negative.  Past Medical History Patient Active Problem List   Diagnosis Date Noted  . Testicular pain 05/14/2015  . Penile papules 05/14/2015  . Possible exposure to STD 05/14/2015    Medications- reviewed and updated  Objective: Office vital signs reviewed. BP 136/80   Pulse 76   Temp 98.5 F (36.9 C) (Oral)   Wt 202 lb (91.6 kg)   BMI 25.94 kg/m    Physical Examination:  General: Awake, alert, well nourished, NAD Left foot: Small black/dark red puncture site. No surrounding erythema. No fluctuance noted. No warmth. Unable to express any fluid. Not tender to touch. Full range of motion of the toes.   Assessment/Plan: No problem-specific Assessment & Plan notes found for this encounter. Puncture wound: The patient presenting with a puncture wound from 2 days ago. He denies any systemic symptoms. The area is healing well, without any evidence of infection. From the examination, there does not appear to be any retained foreign objects. The patient is not up-to-date on his tetanus vaccine. This was given today. Discussed return precautions.  Orders Placed This Encounter  Procedures  . Td :  Tetanus/diphtheria >7yo Preservative  free  . Flu Vaccine QUAD 36+ mos IM    No orders of the defined types were placed in this encounter.   Joanna Puffrystal S. Lexxus Underhill PGY-3, [facility]

## 2015-11-09 NOTE — Telephone Encounter (Signed)
Pt call states he stepped on a nail at work and wanted to be sure that he has had his tetanus shot.. States that he was rec'd bloodwork in the clinic back in May.. Pt states he  Will wait to hear back from dctr.. States if he is advised by PCP that he has not rec'd it, then he will at that time schedule a lab appt. Please assist

## 2016-01-27 ENCOUNTER — Ambulatory Visit (HOSPITAL_COMMUNITY)
Admission: EM | Admit: 2016-01-27 | Discharge: 2016-01-27 | Disposition: A | Discharge status: Home / Self Care | Payer: BLUE CROSS/BLUE SHIELD | Attending: Emergency Medicine | Admitting: Emergency Medicine

## 2016-01-27 ENCOUNTER — Encounter (HOSPITAL_COMMUNITY): Payer: Self-pay | Admitting: Emergency Medicine

## 2016-01-27 DIAGNOSIS — G44209 Tension-type headache, unspecified, not intractable: Secondary | ICD-10-CM | POA: Diagnosis not present

## 2016-01-27 MED ORDER — DICLOFENAC SODIUM 75 MG PO TBEC: 75.0000 mg | DELAYED_RELEASE_TABLET | Freq: Two times a day (BID) | ORAL | 0 refills | Status: DC

## 2016-01-27 NOTE — ED Triage Notes (Signed)
[facility] a [facility] of a headache that has been occurring off and on for "some time." The patient reported using OTC Excedrin with minimal results.

## 2016-01-27 NOTE — ED Provider Notes (Signed)
CSN: [id number]     Arrival date & time 01/27/16  1358 History   First MD Initiated Contact with Patient 01/27/16 1524     Chief Complaint  Patient presents with  . Headache   (Consider location/radiation/quality/duration/timing/severity/associated sxs/prior Treatment) 21 year old male presents with chief complaint of headache for 2 months. Pain is on the left side of his head, occasionally radiating to the right, moderate intensity, non-pulsating pressure, he has no nausea, no vomiting, no light sensitivity, not affected by movement, he has no sinus pain or pressure. He has been treating with OTC tylenol, ibuprofen, and Excedrin without relief. He does smoke, drinks 6-7 beers/day on the weekend but none during the week, does drink caffeine containing drinks. He has no neck rigidity, no fever, no red flag symptoms.    The history is provided by the patient.  Headache    Past Medical History:  Diagnosis Date  . Pneumonia   . Reactive airway disease    History reviewed. No pertinent surgical history. Family History  Problem Relation Age of Onset  . Hypertension Father   . Hypertension Paternal Grandfather    Social History  Substance Use Topics  . Smoking status: Current Every Day Smoker    Packs/day: 0.30    Types: Cigarettes  . Smokeless tobacco: Never Used  . Alcohol use Yes     Comment: socially    Review of Systems  Reason unable to perform ROS: as covered in HPI.  Neurological: Positive for headaches.  All other systems reviewed and are negative.   Allergies  Penicillins  Home Medications   Prior to Admission medications   Medication Sig Start Date End Date Taking? Authorizing Provider  diclofenac (VOLTAREN) 75 MG EC tablet Take 1 tablet (75 mg total) by mouth 2 (two) times daily. 01/27/16   Dorena BodoLawrence Che Rachal, NP   Meds Ordered and Administered this Visit  Medications - No data to display  BP 126/71 (BP Location: Left Arm)   Pulse 93   Temp 98.4 F (36.9 C)  (Oral)   Resp 16   SpO2 100%  No data found.   Physical Exam  Constitutional: He is oriented to person, place, and time. He appears well-developed and well-nourished. No distress.  HENT:  Head: Normocephalic.  Right Ear: Tympanic membrane and external ear normal.  Left Ear: Tympanic membrane and external ear normal.  Nose: Nose normal. Right sinus exhibits no maxillary sinus tenderness and no frontal sinus tenderness. Left sinus exhibits no maxillary sinus tenderness and no frontal sinus tenderness.  Mouth/Throat: Uvula is midline and oropharynx is clear and moist.  Eyes: EOM are normal. Pupils are equal, round, and reactive to light. Right eye exhibits no discharge. Left eye exhibits no discharge.  Neck: Normal range of motion. Neck supple. No JVD present.  Cardiovascular: Normal rate and regular rhythm.   Pulmonary/Chest: Effort normal and breath sounds normal.  Abdominal: Soft. Bowel sounds are normal.  Lymphadenopathy:    He has no cervical adenopathy.  Neurological: He is alert and oriented to person, place, and time.  Skin: Skin is warm and dry. Capillary refill takes less than 2 seconds. He is not diaphoretic.  Psychiatric: He has a normal mood and affect.  Nursing note and vitals reviewed.   Urgent Care Course   Clinical Course     Procedures (including critical care time)  Labs Review Labs Reviewed - No data to display  Imaging Review No results found.   Visual Acuity Review  Right Eye  Distance:   Left Eye Distance:   Bilateral Distance:    Right Eye Near:   Left Eye Near:    Bilateral Near:         MDM   1. Tension headache   I believe you have a tension headache. I have prescribed a medicine for pain called diclofenac. Take 1 tablet twice a day as needed for pain. I would recommend smoking cessation, and I would recommend reducing the amount of alcohol you consume. When possible, I would advise to have an eye exam and get replacement corrective  lenses to wear. Should your symptoms fail to resolve or worsen, follow up with your primary care provider or return to clinic.     Dorena Bodo, NP 01/27/16 1547

## 2016-01-27 NOTE — Discharge Instructions (Signed)
I believe you have a tension headache. I have prescribed a medicine for pain called diclofenac. Take 1 tablet twice a day as needed for pain. I would recommend smoking cessation, and I would recommend reducing the amount of alcohol you consume. When possible, I would advise to have an eye exam and get replacement corrective lenses to wear. Should your symptoms fail to resolve or worsen, follow up with your primary care provider or return to clinic.

## 2016-02-04 ENCOUNTER — Ambulatory Visit (INDEPENDENT_AMBULATORY_CARE_PROVIDER_SITE_OTHER): Payer: BLUE CROSS/BLUE SHIELD | Admitting: Internal Medicine

## 2016-02-04 DIAGNOSIS — R0789 Other chest pain: Secondary | ICD-10-CM | POA: Diagnosis not present

## 2016-02-04 MED ORDER — NAPROXEN 500 MG PO TABS: 500.0000 mg | ORAL_TABLET | Freq: Two times a day (BID) | ORAL | 0 refills | Status: AC

## 2016-02-04 MED ORDER — RANITIDINE HCL 150 MG PO CAPS: 150.0000 mg | ORAL_CAPSULE | Freq: Two times a day (BID) | ORAL | 0 refills | Status: AC

## 2016-02-04 NOTE — Assessment & Plan Note (Signed)
Think this is likely chest wall pain vs intercostal neuralgia from sitting hunched over while driving in his wife's small car each day. Pain is worse with sitting hunched over and better when he stands up. Think GERD is less likely because he denies heartburn and does not have epigastric pain. No recent URI symptoms or sore throat and no splenomegaly on exam to suggest EBV. No fevers or focal exam findings making pneumonia less likely. - Prescribed Naproxen x 7 days. Stop taking Diclofenac. - Will also prescribe Ranitidine x 7 days to take with the Naproxen - Pt given stretches to help with costochondritis - Reassurance provided - Follow-up in 1 week if not improving

## 2016-02-04 NOTE — Progress Notes (Signed)
   [facility] Phone: [phone]  Subjective:  Neil Bryan is a 21 year old male presenting to clinic with LUQ pain and left sided chest pain for the last 8-9 days. He states the chest pain started [place] He states that his wife's car is very small and he has to sit hunched over. He mostly notices the pain while driving. When he gets out of the car, he has the pain for a few minutes, but then it goes away on its own. He has not taken any medications for this. He was prescribed Diclofenac about 1 week ago for a tension headache, but he only took 2 tablets because they "made him feel weird". He has not taken any other NSAIDs. No recent trauma to the area. No shortness of breath. He endorses cough, but states that this is the same cough he has had for years. No recent sore throat or URI symptoms. He states he has had chest pain in the past when he has had a pneumonia, but this chest pain feels different. No heartburn. Having regular bowel movements and not straining.  ROS: See HPI for pertinent positives and negatives  Past Medical History- noncontributory  Family history reviewed for today's visit. No changes.  Social history- patient is a current smoker. Smokes 4-7 cigarettes per day.  Objective: BP 126/82   Pulse (!) 104   Temp 98.8 F (37.1 C) (Oral)   Ht 6\' 2"  (1.88 m)   Wt 210 lb 6.4 oz (95.4 kg)   SpO2 97%   BMI 27.01 kg/m  Gen: NAD, alert, cooperative with exam HEENT: NCAT, EOMI, MMM CV: RRR, no murmur, tenderness to palpation of the chest wall on the left. Resp: CTABL, no wheezes, normal work of breathing GI: +BS, soft, +tenderness to palpation in the LUQ, no splenomegaly, no rebound, +initial mild guarding but Pt stops guarding when he is distracted Skin: No rashes, no lesions on left flank  Assessment/Plan: Chest Wall Pain: Think this is likely chest wall pain vs intercostal neuralgia from sitting hunched over while  driving in his wife's small car each day. Pain is worse with sitting hunched over and better when he stands up. Think GERD is less likely because he denies heartburn and does not have epigastric pain. No recent URI symptoms or sore throat and no splenomegaly on exam to suggest EBV. No fevers or focal exam findings making pneumonia less likely. - Prescribed Naproxen x 7 days. Stop taking Diclofenac. - Will also prescribe Ranitidine x 7 days to take with the Naproxen - Pt given stretches to help with costochondritis - Reassurance provided - Follow-up in 1 week if not improving    Willadean CarolKaty Kendyl Bissonnette, MD PGY-2

## 2016-02-04 NOTE — Patient Instructions (Signed)
It was so nice to meet you!  I think your pain is coming from irritation between your ribs. This is likely caused by you sitting hunched over for a long period of time.  I have prescribed two medications- Naproxen and Ranitidine. Please take them together twice a day for 7 days. I would also recommend that you stretch to help relax those muscles. If you are not starting to get better in 1 week, please come back to see [place].  -Dr. Nancy Marus   Costochondritis Costochondritis is swelling and irritation (inflammation) of the tissue (cartilage) that connects your ribs to your breastbone (sternum). This causes pain in the front of your chest. The pain usually starts gradually and involves more than one rib. What are the causes? The exact cause of this condition is not always known. It results from stress on the cartilage where your ribs attach to your sternum. The cause of this stress could be:  Chest injury (trauma).  Exercise or activity, such as lifting.  Severe coughing. What increases the risk? You may be at higher risk for this condition if you:  Are male.  Are 71?22 years old.  Recently started a new exercise or work activity.  Have low levels of vitamin D.  Have a condition that makes you cough frequently. What are the signs or symptoms? The main symptom of this condition is chest pain. The pain:  Usually starts gradually and can be sharp or dull.  Gets worse with deep breathing, coughing, or exercise.  Gets better with rest.  May be worse when you press on the sternum-rib connection (tenderness). How is this diagnosed? This condition is diagnosed based on your symptoms, medical history, and a physical exam. Your health care provider will check for tenderness when pressing on your sternum. This is the most important finding. You may also have tests to rule out other causes of chest pain. These may include:  A chest X-ray to check for lung problems.  An electrocardiogram  (ECG) to see if you have a heart problem that could be causing the pain.  An imaging scan to rule out a chest or rib fracture. How is this treated? This condition usually goes away on its own over time. Your health care provider may prescribe an NSAID to reduce pain and inflammation. Your health care provider may also suggest that you:  Rest and avoid activities that make pain worse.  Apply heat or cold to the area to reduce pain and inflammation.  Do exercises to stretch your chest muscles. If these treatments do not help, your health care provider may inject a numbing medicine at the sternum-rib connection to help relieve the pain. Follow these instructions at home:  Avoid activities that make pain worse. This includes any activities that use chest, abdominal, and side muscles.  If directed, put ice on the painful area:  Put ice in a plastic bag.  Place a towel between your skin and the bag.  Leave the ice on for 20 minutes, 2-3 times a day.  If directed, apply heat to the affected area as often as told by your health care provider. Use the heat source that your health care provider recommends, such as a moist heat pack or a heating pad.  Place a towel between your skin and the heat source.  Leave the heat on for 20-30 minutes.  Remove the heat if your skin turns bright red. This is especially important if you are unable to feel pain, heat, or  cold. You may have a greater risk of getting burned.  Take over-the-counter and prescription medicines only as told by your health care provider.  Return to your normal activities as told by your health care provider. Ask your health care provider what activities are safe for you.  Keep all follow-up visits as told by your health care provider. This is important. Contact a health care provider if:  You have chills or a fever.  Your pain does not go away or it gets worse.  You have a cough that does not go away (is persistent). Get  help right away if:  You have shortness of breath. This information is not intended to replace advice given to you by your health care provider. Make sure you discuss any questions you have with your health care provider. Document Released: 10/09/2004 Document Revised: 07/20/2015 Document Reviewed: 04/25/2015 Elsevier Interactive Patient Education  2017 [employer]

## 2016-11-14 ENCOUNTER — Ambulatory Visit (INDEPENDENT_AMBULATORY_CARE_PROVIDER_SITE_OTHER): Payer: Managed Care, Other (non HMO) | Admitting: Family Medicine

## 2016-11-14 DIAGNOSIS — G43009 Migraine without aura, not intractable, without status migrainosus: Secondary | ICD-10-CM

## 2016-11-14 DIAGNOSIS — G8929 Other chronic pain: Secondary | ICD-10-CM

## 2016-11-14 DIAGNOSIS — R51 Headache: Secondary | ICD-10-CM

## 2016-11-14 DIAGNOSIS — R519 Headache, unspecified: Secondary | ICD-10-CM | POA: Insufficient documentation

## 2016-11-14 MED ORDER — SUMATRIPTAN SUCCINATE 50 MG PO TABS: 50.0000 mg | ORAL_TABLET | ORAL | 0 refills | Status: AC | PRN

## 2016-11-14 NOTE — Assessment & Plan Note (Addendum)
DDx includes migraine without aura given throbbing quality. Tension headache given stress in his life and squeezing quality. Also likely is medication overuse headache [facility] given has been taking excedrin daily. Offered trial of imitrex over the next 2 weeks to see if patient is able to taper off his daily excedrin use and given reading on both migraines and MOH. Discussed ways in which to manage stress to hopefully mitigate some of his triggers which includes exercise and having some time off for himself. Advised follow up with PCP in a few weeks to see how imitrex is helping him and if has had any success with trying less excedrin.

## 2016-11-14 NOTE — Progress Notes (Signed)
    Subjective:  Neil Bryan is a 21 y.o. male who [facility] with a chief complaint of headaches.   HPI:  Headaches - Had migraines when younger, went away with smoking cigarettes - Now quit smoking and drinking alcohol, and has noticed that migraines are worse - Mostly occurs on L side of head but sometimes bilateral and at base of neck. Feels like a squeezing or throbbing sensation. - no aura - Sometimes has pain behind L eye but no blurry vision or double vision or tearing and face sensitivity - Headaches have occasionally kept him up at night. - Occurs daily lasts approx 2-3 hours, sometimes relieved by excedrin but other times will last all day and return the next day.  - Takes excedrin daily - Big trigger for him is that has been very stressed lately. Part time in school and working full time. Different shifts with wife. Commuting a lot.   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 PERRL. EOMI. CV: RRR with no murmurs appreciated Pulm: NWOB, CTAB with no crackles, wheezes, or rhonchi GI: Normal bowel sounds present. Soft, Nontender, Nondistended. MSK: no edema, cyanosis, or clubbing noted. No TTP over spine or paraspinals. Skin: warm, dry Neuro: grossly normal, moves all extremities. Strength 5/5. Psych: Normal affect and thought content   Assessment/Plan:  Chronic headache DDx includes migraine without aura given throbbing quality. Tension headache given stress in his life and squeezing quality.  Also likely is medication overuse headache [facility](MOH) given has been taking excedrin daily. Offered trial of imitrex over the next 2 weeks to see if patient is able to taper off his daily excedrin use and given reading on both migraines and MOH. Discussed ways in which to manage stress to hopefully mitigate some of his triggers which includes exercise and having some time off for himself. Advised follow up with PCP in a few weeks to see how imitrex is helping him and if has had any success with trying less excedrin.  MSK back pain Likely from overuse with patient's occupation. Reassured patient and discussed OTC tylenol and ibuprofen with heating pads and stretching.   Leland HerElsia J Yoo, DO PGY-2, [facility] 11/14/2016 10:18 AM

## 2016-11-14 NOTE — Patient Instructions (Addendum)
Migraine Headache A migraine headache is an intense, throbbing pain on one side or both sides of the head. Migraines may also cause other symptoms, such as nausea, vomiting, and sensitivity to light and noise. What are the causes? Doing or taking certain things may also trigger migraines, such as:  Alcohol.  Smoking.  Medicines, such as: ? Medicine used to treat chest pain (nitroglycerine). ? Birth control pills. ? Estrogen pills. ? Certain blood pressure medicines.  Aged cheeses, chocolate, or caffeine.  Foods or drinks that contain nitrates, glutamate, aspartame, or tyramine.  Physical activity.  Other things that may trigger a migraine include:  Menstruation.  Pregnancy.  Hunger.  Stress, lack of sleep, too much sleep, or fatigue.  Weather changes.  What increases the risk? The following factors may make you more likely to experience migraine headaches:  Age. Risk increases with age.  Family history of migraine headaches.  Being Caucasian.  Depression and anxiety.  Obesity.  Being a woman.  Having a hole in the heart (patent foramen ovale) or other heart problems.  What are the signs or symptoms? The main symptom of this condition is pulsating or throbbing pain. Pain may:  Happen in any area of the head, such as on one side or both sides.  Interfere with daily activities.  Get worse with physical activity.  Get worse with exposure to bright lights or loud noises.  Other symptoms may include:  Nausea.  Vomiting.  Dizziness.  General sensitivity to bright lights, loud noises, or smells.  Before you get a migraine, you may get warning signs that a migraine is developing (aura). An aura may include:  Seeing flashing lights or having blind spots.  Seeing bright spots, halos, or zigzag lines.  Having tunnel vision or blurred vision.  Having numbness or a tingling feeling.  Having trouble talking.  Having muscle weakness.  How is this  diagnosed? A migraine headache can be diagnosed based on:  Your symptoms.  A physical exam.  Tests, such as CT scan or MRI of the head. These imaging tests can help rule out other causes of headaches.  Taking fluid from the spine (lumbar puncture) and analyzing it (cerebrospinal fluid analysis, or CSF analysis).  How is this treated? A migraine headache is usually treated with medicines that:  Relieve pain.  Relieve nausea.  Prevent migraines from coming back.  Treatment may also include:  Acupuncture.  Lifestyle changes like avoiding foods that trigger migraines.  Follow these instructions at home: Medicines  Take over-the-counter and prescription medicines only as told by your health care provider.  Do not drive or use heavy machinery while taking prescription pain medicine.  To prevent or treat constipation while you are taking prescription pain medicine, your health care provider may recommend that you: ? Drink enough fluid to keep your urine clear or pale yellow. ? Take over-the-counter or prescription medicines. ? Eat foods that are high in fiber, such as fresh fruits and vegetables, whole grains, and beans. ? Limit foods that are high in fat and processed sugars, such as fried and sweet foods. Lifestyle  Avoid alcohol use.  Do not use any products that contain nicotine or tobacco, such as cigarettes and e-cigarettes. If you need help quitting, ask your health care provider.  Get at least 8 hours of sleep every night.  Limit your stress. General instructions   Keep a journal to find out what may trigger your migraine headaches. For example, write down: ? What you eat and   drink. ? How much sleep you get. ? Any change to your diet or medicines.  If you have a migraine: ? Avoid things that make your symptoms worse, such as bright lights. ? It may help to lie down in a dark, quiet room. ? Do not drive or use heavy machinery. ? Ask your health care provider  what activities are safe for you while you are experiencing symptoms.  Keep all follow-up visits as told by your health care provider. This is important. Contact a health care provider if:  You develop symptoms that are different or more severe than your usual migraine symptoms. Get help right away if:  Your migraine becomes severe.  You have a fever.  You have a stiff neck.  You have vision loss.  Your muscles feel weak or like you cannot control them.  You start to lose your balance often.  You develop trouble walking.  You faint. This information is not intended to replace advice given to you by your health care provider. Make sure you discuss any questions you have with your health care provider. Document Released: 12/30/2004 Document Revised: 07/20/2015 Document Reviewed: 06/18/2015 Elsevier Interactive Patient Education  2017 Elsevier Inc.   

## 2016-12-19 ENCOUNTER — Encounter (HOSPITAL_COMMUNITY): Payer: Self-pay | Admitting: Emergency Medicine

## 2016-12-19 ENCOUNTER — Ambulatory Visit (HOSPITAL_COMMUNITY)
Admission: EM | Admit: 2016-12-19 | Discharge: 2016-12-19 | Disposition: A | Discharge status: Home / Self Care | Payer: Managed Care, Other (non HMO) | Attending: Internal Medicine | Admitting: Internal Medicine

## 2016-12-19 DIAGNOSIS — R109 Unspecified abdominal pain: Secondary | ICD-10-CM

## 2016-12-19 MED ORDER — POLYETHYLENE GLYCOL 3350 17 GM/SCOOP PO POWD
17.0000 g | Freq: Every day | ORAL | 0 refills | Status: AC
Start: 2016-12-19 — End: ?

## 2016-12-19 NOTE — ED Triage Notes (Signed)
Pt here for left sided abd pain; pt sts hx of similar when was constipated; pt sts having normal BMs

## 2016-12-19 NOTE — Discharge Instructions (Addendum)
No danger signs on exam today.  Pain in the left abdomen may be due to tendency towards constipation, or a little bit of viral inflammation.  Adding fiber to the diet can temporarily increase discomfort by causing bloating or gassiness.  Might try eating an apple every day, and switching fiber supplement to MiraLAX, which causes less gas.  Prescription for MiraLAX sent to the pharmacy.  Anticipate gradual improvement in discomfort, in bowel movements, and in appetite, over the next few weeks.  Recheck or follow-up with a primary care provider if this does not occur.

## 2016-12-19 NOTE — ED Provider Notes (Signed)
[facility]    CSN: [id number] Arrival date & time: 12/19/16  1739     History   Chief Complaint Chief Complaint  Patient presents with  . Abdominal Pain    HPI Neil Bryan is a 21 y.o. male.   He presents today with left-sided abdominal discomfort.  He was seen at a [facility] in the last several days, and diagnosed with constipation after x-rays, labs, urine test.  This improved with mag citrate, and he has added some fiber to his diet.  However, he is still having to strain with bowel movements although he is having a daily bowel movement.  He also feels pressure in his left abdomen.  No fever, no malaise.  No urinary symptoms.  Not vomiting.    HPI  Past Medical History:  Diagnosis Date  . Pneumonia   . Reactive airway disease     Patient Active Problem List   Diagnosis Date Noted  . Chronic headache 11/14/2016  . Chest wall pain 02/04/2016  . Testicular pain 05/14/2015  . Penile papules 05/14/2015  . Possible exposure to STD 05/14/2015    History reviewed. No pertinent surgical history.     Home Medications    Prior to Admission medications   Medication Sig Start Date End Date Taking? Authorizing Provider  naproxen (NAPROSYN) 500 MG tablet Take 1 tablet (500 mg total) by mouth 2 (two) times daily with a meal. 02/04/16   Mayo, Allyn KennerKaty Dodd, MD  polyethylene glycol powder (GLYCOLAX/MIRALAX) powder Take 17 g by mouth daily. In juice or fluid of choice, to achieve 1-2 soft stools daily 12/19/16   Eustace MooreMurray, Annis Lagoy W, MD  ranitidine (ZANTAC) 150 MG capsule Take 1 capsule (150 mg total) by mouth 2 (two) times daily. 02/04/16   Mayo, Allyn KennerKaty Dodd, MD  SUMAtriptan (IMITREX) 50 MG tablet Take 1 tablet (50 mg total) by mouth every 2 (two) hours as needed for migraine. May repeat in 2 hours if headache persists or recurs. 11/14/16   Leland HerYoo, Elsia J, DO    Family History Family History  Problem Relation Age of Onset  . Hypertension Father   . Hypertension  Paternal Grandfather     Social History Social History   Tobacco Use  . Smoking status: Current Every Day Smoker    Packs/day: 0.30    Types: Cigarettes  . Smokeless tobacco: Never Used  Substance Use Topics  . Alcohol use: Yes    Comment: socially  . Drug use: No     Allergies   Penicillins   Review of Systems Review of Systems  All other systems reviewed and are negative.    Physical Exam Triage Vital Signs ED Triage Vitals [12/19/16 1756]  Enc Vitals Group     BP 120/84     Pulse Rate 75     Resp 18     Temp 98.2 F (36.8 C)     Temp Source Oral     SpO2 100 %     Weight      Height      Pain Score      Pain Loc    Updated Vital Signs BP 120/84 (BP Location: Right Arm)   Pulse 75   Temp 98.2 F (36.8 C) (Oral)   Resp 18   SpO2 100%   Physical Exam  Constitutional: He is oriented to person, place, and time. No distress.  Alert, nicely groomed  HENT:  Head: Atraumatic.  Eyes:  Conjugate gaze, no  eye redness/drainage  Neck: Neck supple.  Cardiovascular: Normal rate and regular rhythm.  Pulmonary/Chest: No respiratory distress. He has no wheezes. He has no rales.  Abdominal: Soft. He exhibits no distension. There is no rebound and no guarding.  Mild tenderness to deep palpation in the left lower quadrant  Musculoskeletal: Normal range of motion.  Neurological: He is alert and oriented to person, place, and time.  Skin: Skin is warm and dry.  No cyanosis  Nursing note and vitals reviewed.    UC Treatments / Results   Procedures Procedures (including critical care time) None today  Final Clinical Impressions(s) / UC Diagnoses   Final diagnoses:  Left sided abdominal pain   No danger signs on exam today.  Pain in the left abdomen may be due to tendency towards constipation, or a little bit of viral inflammation.  Adding fiber to the diet can temporarily increase discomfort by causing bloating or gassiness.  Might try eating an apple every  day, and switching fiber supplement to MiraLAX, which causes less gas.  Prescription for MiraLAX sent to the pharmacy.  Anticipate gradual improvement in discomfort, in bowel movements, and in appetite, over the next few weeks.  Recheck or follow-up with a primary care provider if this does not occur.  ED Discharge Orders        Ordered    polyethylene glycol powder (GLYCOLAX/MIRALAX) powder  Daily     12/19/16 1842       Controlled Substance Prescriptions [place] Controlled Substance Registry consulted? No   Eustace MooreMurray, Shardai Star W, MD 12/21/16 2059

## 2017-02-25 IMAGING — DX DG CHEST 2V
2 series · 2 of 2 positions shown · non-contrast
Comparison: Chest radiograph performed 05/28/2013

CLINICAL DATA: Acute onset of cough and shortness of breath.
Initial encounter.

EXAM:
CHEST  2 VIEW

[chest pa]
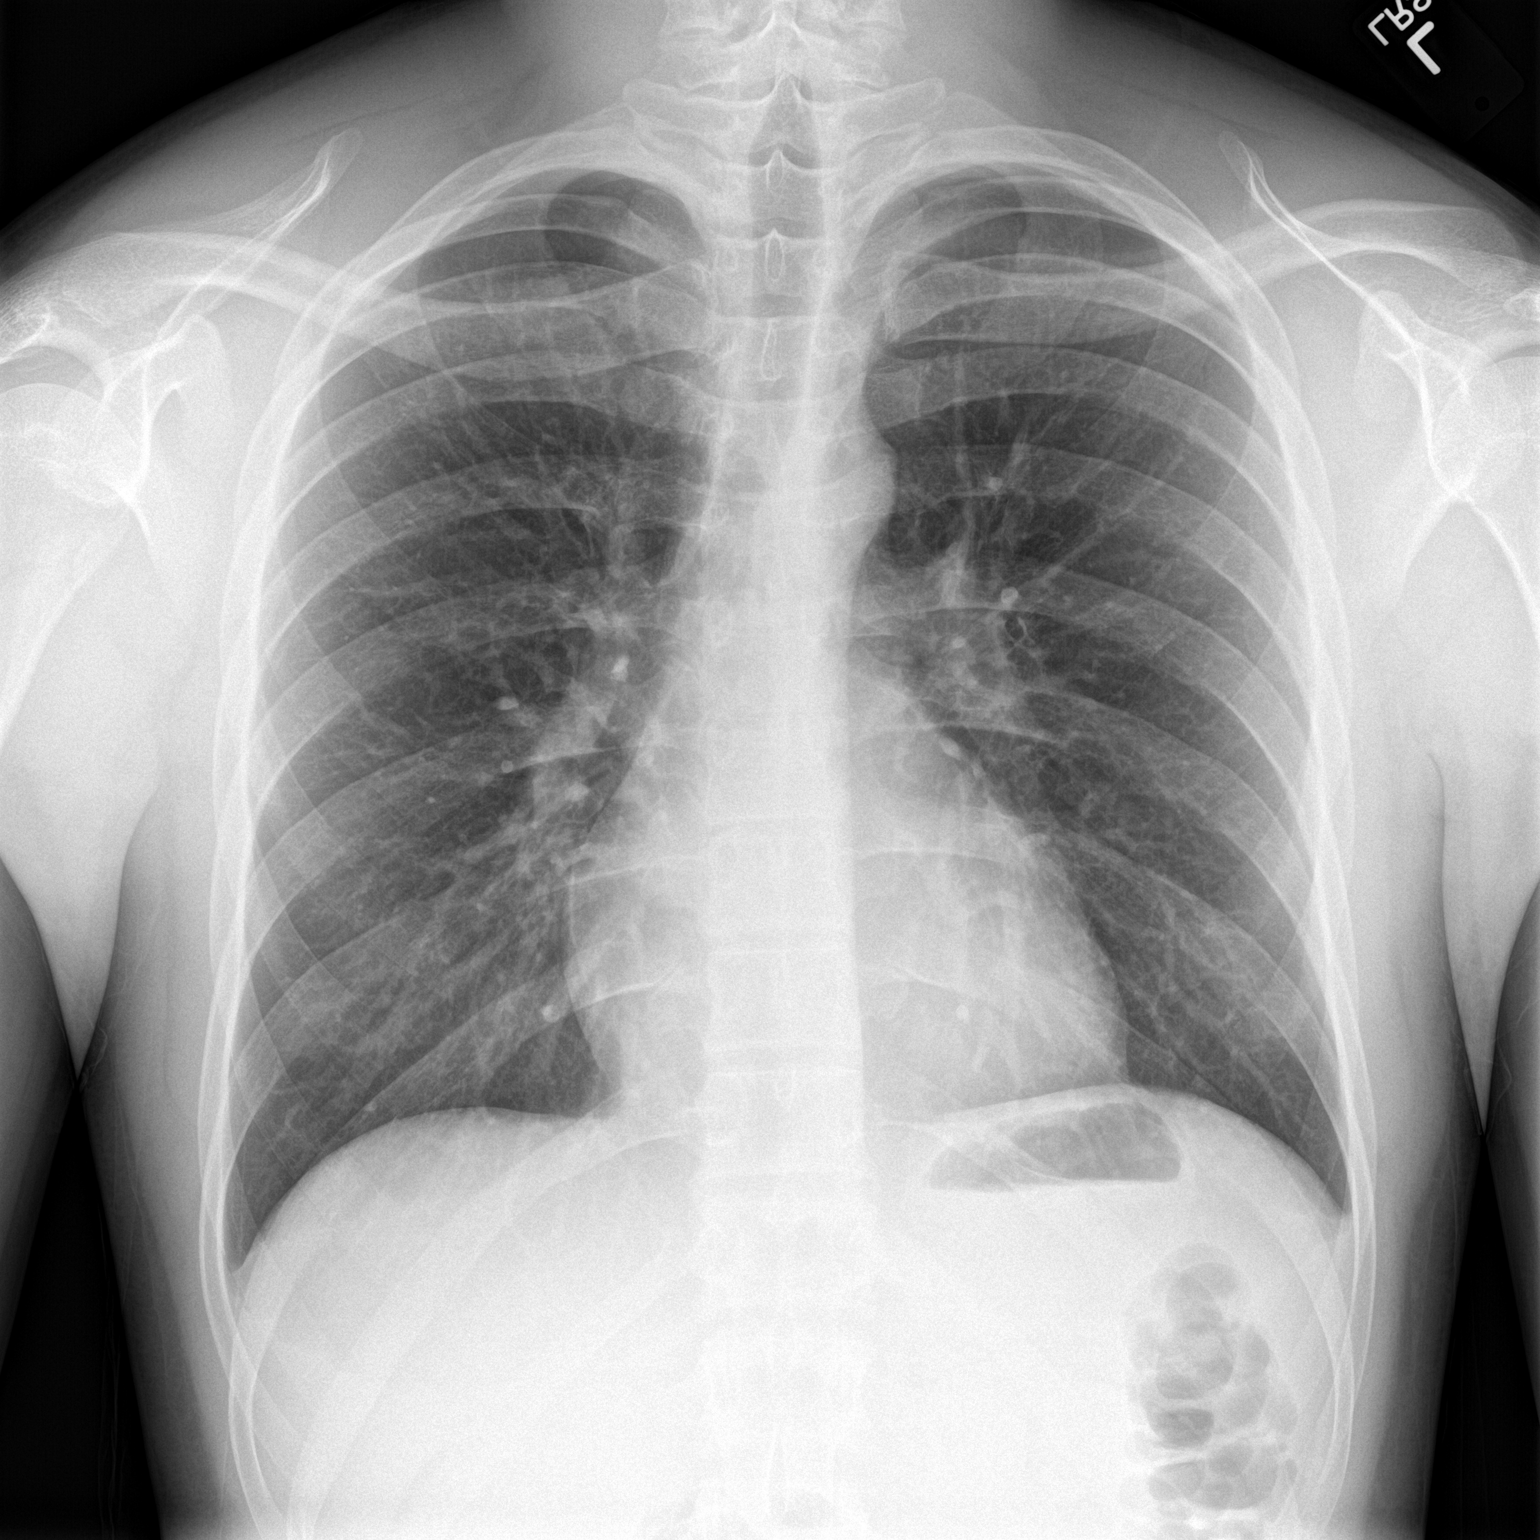

[chest lat]
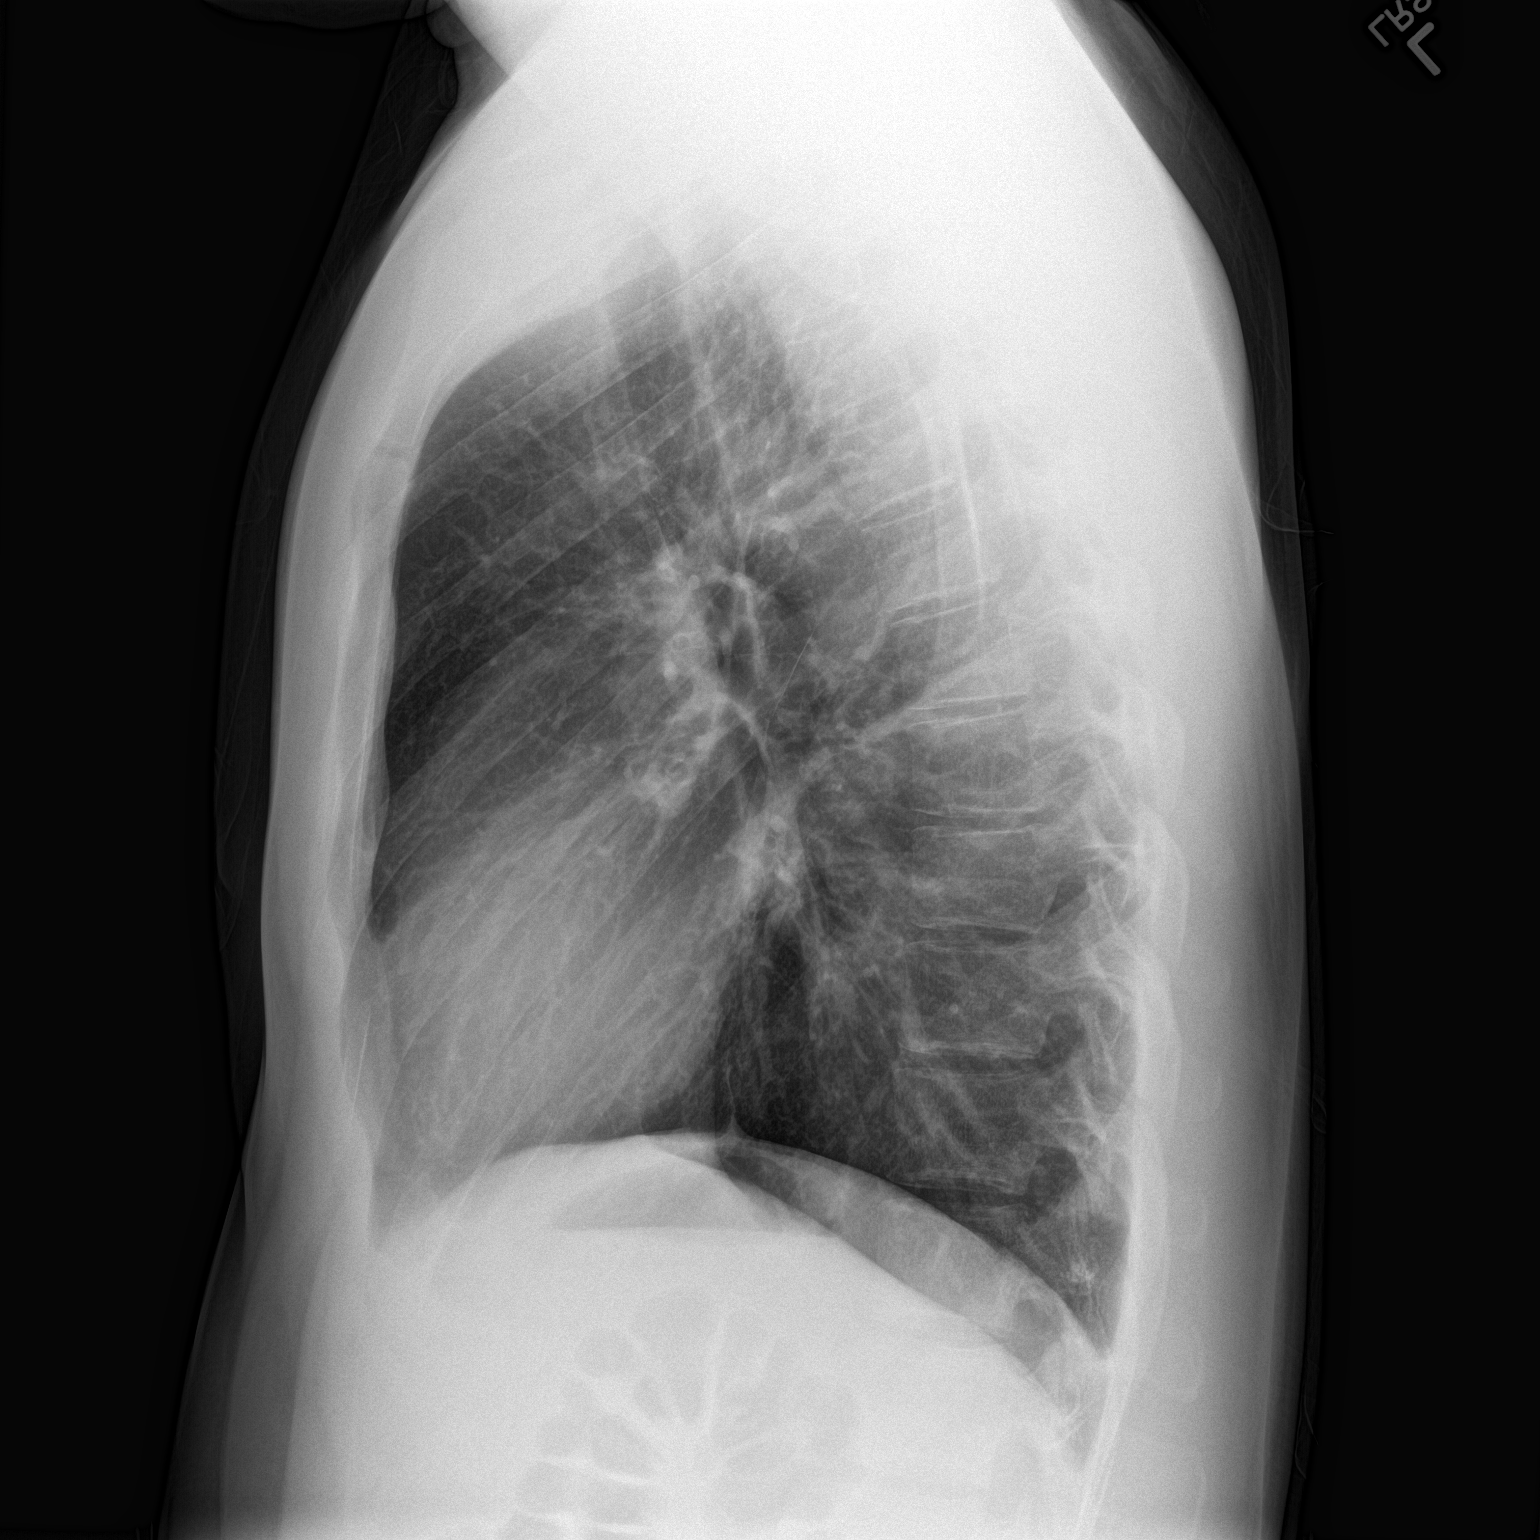

[2 of 2 positions shown; findings below may reference images not displayed]

FINDINGS: The lungs are well-aerated. Mild peribronchial thickening is noted.
There is no evidence of focal opacification, pleural effusion or
pneumothorax.

The heart is normal in size; the mediastinal contour is within
normal limits. No acute osseous abnormalities are seen.
IMPRESSION: Mild peribronchial thickening noted; lungs otherwise clear.

## 2020-01-19 ENCOUNTER — Other Ambulatory Visit: Payer: Self-pay

## 2020-01-19 ENCOUNTER — Emergency Department (INDEPENDENT_AMBULATORY_CARE_PROVIDER_SITE_OTHER)
Admission: EM | Admit: 2020-01-19 | Discharge: 2020-01-19 | Disposition: A | Discharge status: Home / Self Care | Payer: Managed Care, Other (non HMO) | Source: Home / Self Care

## 2020-01-19 DIAGNOSIS — R1011 Right upper quadrant pain: Secondary | ICD-10-CM

## 2020-01-19 DIAGNOSIS — R1013 Epigastric pain: Secondary | ICD-10-CM | POA: Diagnosis not present

## 2020-01-19 LAB — POCT CBC W AUTO DIFF (K'VILLE URGENT CARE)

## 2020-01-19 MED ORDER — OMEPRAZOLE 20 MG PO CPDR: 20.0000 mg | DELAYED_RELEASE_CAPSULE | Freq: Every day | ORAL | 0 refills | Status: AC

## 2020-01-19 NOTE — Discharge Instructions (Signed)
°  Take the medication as prescribed. Try to stick with a bland diet and limit stress as much as possible. Call to schedule a follow up appointment with primary care next week if not improving.   Call 911 or have someone drive you to the hospital if symptoms significantly worsening- worsening pain, unable to keep down fluids, or other new concerning symptoms develop.

## 2020-01-19 NOTE — ED Triage Notes (Signed)
Patient presents to Urgent Care with complaints of mid abdominal pain and "pulses" near his stomach since yesterday after work. Patient reports last night he checked his BP at home and it was elevated (does not remember what it was exactly).  Pt reports high stress baseline, is also about to have his second child and has two jobs, questioning possible anxiety involvement.

## 2020-01-19 NOTE — ED Provider Notes (Signed)
[facility]    CSN: [id number] Arrival date & time: 01/19/20  1608      History   Chief Complaint Chief Complaint  Patient presents with  . Abdominal Pain    HPI Neil Bryan is a 25 y.o. male.   HPI Neil Bryan is a 25 y.o. male presenting to UC with c/o mid to upper abdominal pain described as "pulses" in his abdomen that started a few days ago after smoking a cigar but yesterday it occurred without smoking.  Today he has had a dull ache, 4/10. Eating and drinking does not make it better or worse. Denies fever, chills, n/v/d. No chest pain or SOB.  He has been taking OTC tumeric, a daily vitamin and ginger without relief.  He states his BP was elevated last night, does not recall how high but states he was "on [employer]."  Reports being under a lot of stress recently with two jobs and another child on the way.  No hx of gallbladder or pancreas issues. No hx of GERD.   Past Medical History:  Diagnosis Date  . Pneumonia   . Reactive airway disease     Patient Active Problem List   Diagnosis Date Noted  . Chronic headache 11/14/2016  . Chest wall pain 02/04/2016  . Testicular pain 05/14/2015  . Penile papules 05/14/2015  . Possible exposure to STD 05/14/2015    History reviewed. No pertinent surgical history.     Home Medications    Prior to Admission medications   Medication Sig Start Date End Date Taking? Authorizing Provider  omeprazole (PRILOSEC) 20 MG capsule Take 1 capsule (20 mg total) by mouth daily. 01/19/20  Yes [place] O, PA-C  naproxen (NAPROSYN) 500 MG tablet Take 1 tablet (500 mg total) by mouth 2 (two) times daily with a meal. 02/04/16   Mayo, Allyn Kenner, MD  polyethylene glycol powder (GLYCOLAX/MIRALAX) powder Take 17 g by mouth daily. In juice or fluid of choice, to achieve 1-2 soft stools daily 12/19/16   Isa Rankin, MD  ranitidine (ZANTAC) 150 MG capsule Take 1 capsule (150 mg total) by mouth 2 (two) times daily.  02/04/16   Mayo, Allyn Kenner, MD  SUMAtriptan (IMITREX) 50 MG tablet Take 1 tablet (50 mg total) by mouth every 2 (two) hours as needed for migraine. May repeat in 2 hours if headache persists or recurs. 11/14/16   Leland Her, DO    Family History Family History  Problem Relation Age of Onset  . Hypertension Father   . Hypertension Paternal Grandfather   . Healthy Mother     Social History Social History   Tobacco Use  . Smoking status: Current Every Day Smoker    Packs/day: 0.30    Types: Cigarettes, Cigars  . Smokeless tobacco: Never Used  Substance Use Topics  . Alcohol use: Yes    Comment: socially  . Drug use: No     Allergies   Penicillins   Review of Systems Review of Systems  Constitutional: Negative for chills and fever.  HENT: Negative for congestion, ear pain, sore throat, trouble swallowing and voice change.   Respiratory: Negative for cough and shortness of breath.   Cardiovascular: Negative for chest pain and palpitations.  Gastrointestinal: Positive for abdominal pain. Negative for diarrhea, nausea and vomiting.  Musculoskeletal: Negative for arthralgias, back pain and myalgias.  Skin: Negative for rash.  All other systems reviewed and are negative.    Physical Exam  Triage Vital Signs ED Triage Vitals  Enc Vitals Group     BP 01/19/20 1655 128/85     Pulse Rate 01/19/20 1655 85     Resp 01/19/20 1655 16     Temp 01/19/20 1655 98 F (36.7 C)     Temp Source 01/19/20 1655 Oral     SpO2 01/19/20 1655 99 %     Weight --      Height --      Head Circumference --      Peak Flow --      Pain Score 01/19/20 1652 4     Pain Loc --      Pain Edu? --      Excl. in [place]? --    No data found.  Updated Vital Signs BP 128/85 (BP Location: Left Arm)   Pulse 85   Temp 98 F (36.7 C) (Oral)   Resp 16   SpO2 99%   Visual Acuity Right Eye Distance:   Left Eye Distance:   Bilateral Distance:    Right Eye Near:   Left Eye Near:    Bilateral  Near:     Physical Exam Vitals and nursing note reviewed.  Constitutional:      General: He is not in acute distress.    Appearance: He is well-developed and well-nourished. He is not ill-appearing, toxic-appearing or diaphoretic.  HENT:     Head: Normocephalic and atraumatic.  Eyes:     Extraocular Movements: EOM normal.  Cardiovascular:     Rate and Rhythm: Normal rate and regular rhythm.  Pulmonary:     Effort: Pulmonary effort is normal. No respiratory distress.     Breath sounds: Normal breath sounds. No stridor. No wheezing, rhonchi or rales.  Abdominal:     General: There is no distension.     Palpations: Abdomen is soft.     Tenderness: There is abdominal tenderness in the right upper quadrant and epigastric area. There is no right CVA tenderness, left CVA tenderness, guarding or rebound. Negative signs include Murphy's sign and McBurney's sign.  Musculoskeletal:        General: Normal range of motion.     Cervical back: Normal range of motion.  Skin:    General: Skin is warm and dry.  Neurological:     Mental Status: He is alert and oriented to person, place, and time.  Psychiatric:        Mood and Affect: Mood and affect normal.        Behavior: Behavior normal.      UC Treatments / Results  Labs (all labs ordered are listed, but only abnormal results are displayed) Labs Reviewed  COMPLETE METABOLIC PANEL WITH GFR  LIPASE  POCT CBC W AUTO DIFF ([place])    EKG   Radiology No results found.  Procedures Procedures (including critical care time)  Medications Ordered in UC Medications - No data to display  Initial Impression / Assessment and Plan / UC Course  I have reviewed the triage vital signs and the nursing notes.  Pertinent labs & imaging results that were available during my care of the patient were reviewed by me and considered in my medical decision making (see chart for details).     No evidence of acute abdomen at this  time. Will have pt discontinue OTC supplements  May try trial of omeprazole F/u with PCP  Discussed symptoms that warrant emergent care in the ED. AVS given  Final Clinical  Impressions(s) / UC Diagnoses   Final diagnoses:  Abdominal pain, epigastric  Abdominal pain, right upper quadrant   Discharge Instructions   None    ED Prescriptions    Medication Sig Dispense Auth. Provider   omeprazole (PRILOSEC) 20 MG capsule Take 1 capsule (20 mg total) by mouth daily. 30 capsule Lurene Shadow, [place]     PDMP not reviewed this encounter.   Lurene Shadow, [place] 01/19/20 1826

## 2020-01-20 LAB — COMPLETE METABOLIC PANEL WITH GFR
AG Ratio: 2 (calc) (ref 1.0–2.5)
ALT: 26 U/L (ref 9–46)
AST: 18 U/L (ref 10–40)
Albumin: 4.5 g/dL (ref 3.6–5.1)
Alkaline phosphatase (APISO): 54 U/L (ref 36–130)
BUN: 10 mg/dL (ref 7–25)
CO2: 24 mmol/L (ref 20–32)
Calcium: 9.8 mg/dL (ref 8.6–10.3)
Chloride: 104 mmol/L (ref 98–110)
Creat: 0.94 mg/dL (ref 0.60–1.35)
GFR, Est African American: 130 mL/min/{1.73_m2} (ref >=60)
GFR, Est Non African American: 112 mL/min/{1.73_m2} (ref >=60)
Globulin: 2.3 g/dL (calc) (ref 1.9–3.7)
Glucose, Bld: 76 mg/dL (ref 65–99)
Potassium: 4.3 mmol/L (ref 3.5–5.3)
Sodium: 138 mmol/L (ref 135–146)
Total Bilirubin: 0.5 mg/dL (ref 0.2–1.2)
Total Protein: 6.8 g/dL (ref 6.1–8.1)

## 2020-01-20 LAB — LIPASE: Lipase: 20 U/L (ref 7–60)
